# Patient Record
Sex: Female | Born: 1960
Health system: Southern US, Community
[De-identification: ages and names within clinical notes are randomized; demographics above are authoritative.]

## PROBLEM LIST (undated history)

## (undated) DIAGNOSIS — E785 Hyperlipidemia, unspecified: Secondary | ICD-10-CM

## (undated) HISTORY — PX: BLADDER SUSPENSION: SHX72

## (undated) HISTORY — DX: Hyperlipidemia, unspecified: E78.5

## (undated) HISTORY — PX: BREAST ENHANCEMENT SURGERY: SHX7

---

## 1987-02-20 HISTORY — PX: AUGMENTATION MAMMAPLASTY: SUR837

## 1988-02-20 HISTORY — PX: BREAST SURGERY: SHX581

## 2008-11-05 ENCOUNTER — Encounter: Admission: RE | Admit: 2008-11-05 | Discharge: 2008-11-05 | Payer: Self-pay | Admitting: Obstetrics and Gynecology

## 2009-12-19 ENCOUNTER — Encounter: Admission: RE | Admit: 2009-12-19 | Discharge: 2009-12-19 | Payer: Self-pay | Admitting: Obstetrics

## 2010-03-06 LAB — CBC
HCT: 36.6 % (ref 36.0–46.0)
Hemoglobin: 12.1 g/dL (ref 12.0–15.0)
MCH: 30.3 pg (ref 26.0–34.0)
MCHC: 33.1 g/dL (ref 30.0–36.0)
MCV: 91.7 fL (ref 78.0–100.0)
Platelets: 207 10*3/uL (ref 150–400)
RBC: 3.99 MIL/uL (ref 3.87–5.11)
RDW: 13.1 % (ref 11.5–15.5)
WBC: 7 10*3/uL (ref 4.0–10.5)

## 2010-03-08 ENCOUNTER — Ambulatory Visit (HOSPITAL_COMMUNITY)
Admission: RE | Admit: 2010-03-08 | Discharge: 2010-03-08 | Payer: Self-pay | Source: Home / Self Care | Attending: Obstetrics | Admitting: Obstetrics

## 2010-03-13 LAB — ABO/RH: ABO/RH(D): O POS

## 2010-03-13 LAB — PREGNANCY, URINE: Preg Test, Ur: NEGATIVE

## 2010-03-13 LAB — TYPE AND SCREEN
ABO/RH(D): O POS
Antibody Screen: NEGATIVE

## 2010-03-27 NOTE — Op Note (Signed)
NAMEBETHEL, Martha Cabrera             ACCOUNT NO.:  [id number]  MEDICAL RECORD NO.:  [id number]          PATIENT TYPE:  AMB  LOCATION:  SDC                           FACILITY:  [facility]  PHYSICIAN:  Lendon Colonel, MD   DATE OF BIRTH:  06-27-1960  DATE OF PROCEDURE:  03/08/2010 DATE OF DISCHARGE:                              OPERATIVE REPORT   PREOPERATIVE DIAGNOSES: 1. Desires contraception. 2. Due for intrauterine device change. 3. Stress urinary incontinence.  POSTOPERATIVE DIAGNOSES: 1. Desires contraception. 2. Due for intrauterine device change. 3. Stress urinary incontinence.  PROCEDURES:  IUD removal, Mirena IUD insertion, TVT sling, cystoscopy, TVT Exact sling was used.  SURGEON:  Alphonsus Sias. Ernestina Penna, MD  ASSISTANT:  None.  ANESTHESIA:  General.  FINDINGS:  Grade 1 cystocele and rectocele, 10 cm uterus.  No sling in the bladder postprocedure, bilateral peristalsing ureters, hemostatic bladder perforation site. SPECIMENS:  None.  DISPOSITION:  NA.  ANTIBIOTICS:  One gram of Ancef.  ESTIMATED BLOOD LOSS:  Minimal.  COMPLICATIONS:  Bladder perforation  INDICATIONS:  This is a 50 year old multiparous patient with significant stress urinary incontinence by history.  No urodynamics done due to vasovagal response on attempted catheterization.  Patient understands risks and benefits of sling including perforation, erosion, urinary retention, and opted for TVT sling for presumed ISD.  The patient also was due for a change of her Mirena IUD and the strings were not seen, but preoperative ultrasound confirmed IUD in the uterus.  PROCEDURE IN DETAIL:  After informed consent was obtained, the patient was taken to the operating room where general anesthesia was initially without difficulty.  She was prepped and draped in normal sterile fashion in dorsal supine high lithotomy position.  A speculum was placed into the vagina.  A single-tooth tenaculum was used to grasp  the anterior lip of the cervix.  The IUD string was not seen.  A polyp grasper was placed easily into the uterine cavity and on first pass, brought the IUD string down.  The IUD was removed without complication. The uterus was sounded to 10 cm.  Mirena IUD was then placed without complication.  Tenaculum was removed.  Gloves were changed.  The patient was reprepped.  A solution of 0.5% Marcaine with 1:200,000 units of epinephrine, 30 mL of that mixed with 60 mL of normal saline was used as an injection for the case.  Marks were placed on the mons just above the pubic symphysis 4 cm apart in the midline.  These were the planned exit points and they were marked with a marking pen.  A 20-gauge spinal needle was then placed through the left planned exit point, tracking behind the pubic bone and ending at the vesicovaginal junction in the prevesical space.  The end of the needle could be felt with the vaginal hand, 20 mL of solution was injected into the space, and additional 20 mL was injected along the planned exit tract.  This was done in a similar fashion on the opposite side.  Foley catheter was placed into the bladder.  The balloon was blown up, then midurethral position was noted by tugging on  the Foley balloon and the planned sling placement was evaluated.  Allis clamps was placed 2 cm below the urethra and additional Allis clamp placed 2 cm below that.  A 10 mL of the injection was infused into the epithelial tissue.  A 15 blade was used to incise between the two Allis clamps and the Metzenbaum scissors was used to dissect a track under the vaginal epithelium to just under the pubic bone lateral to the urethra.  This was done on both sides.  The sling device was opened and set.  A rigid Foley catheter was placed into the bladder after the bladder was drained, deviated towards the patient's right knee.  The sling device was set with the anesthesiologist pointing out the patient's right  shoulder.  The trocar was passed in the pre-made track between vaginal epithelium and the deeper tissue just under the pubic bone.  Once under the pubic bone, deviated sharply towards the anterior abdominal wall and sending out through the planned exit point. Cystoscopy was carried out.  No trocar was noted in the bladder. The trocar handle was removed after being pulled through the abdominal wall. The bladder was again drained.  A rigid Foley placed again into the urethra, this time deviating towards the patient's left knee.  The sling device was assembled and placement of the sling through the pre-made track in the vagina with aiming towards the patient's left shoulder which was pointed out by the anesthesiologist.  The sling was pushed through the vagina under the pubic bone and sharp anteversion to the abdominal wall.  Cystoscopy was carried out and at this time perforation on two sites of the bladder on the lateral wall and dome was noted.  The trocar was removed vaginally, with the sling attached. Cystoscopy was carried out.  Irrigation copious was done until the bladder cleared.  The 2.8 mm perforation site was thoroughly inspected and after several minutes became hemostatic.  The ureter was identified was far from the perforation site and good peristalsis and expulsion of clear urine was noted.  The bladder was again drained.  Rigid Foley again placed into the bladder deviated towards the patient's left knee and again the trocar was placed through the aforementioned the pre-made track once passing the pubic bone sharp anteversion to the abdominal wall and passed through the abdominal. Cystoscopy was carried out.  At this time, no abnormalities of the bladder.  Bladder was distended to 300 mL and no trocar was noted in the bladder.  Cystoscope was removed. The bladder remained filled at about 300 mL.  The trocar guide was removed.  The trocar was pulled through abdominal wall.  The  mesh was pulled up with care being taken have tension free placement, first with a Babcock in the midposition of the sling tape and then with a right angle clamp.  Once the sling was properly seated, the plastic sleeves were removed.  The vaginal epithelium was closed with figure-of- eight 2-0 Vicryl sutures.  The sling tape was then cut at the abdominal wall.  The abdominal incisions were closed with Dermabond.  Vagina was reinspected.  Hemostasis of the uterus with the IUD string, appropriate length, and hemostasis of the vaginal epithelium.  The patient tolerated the procedure well.  Foley catheter was placed.  The patient taken to recovery room for planned trial of voiding.     Lendon Colonel, MD     KAF/MEDQ  D:  03/08/2010  T:  03/08/2010  Job:  [id number]  Electronically Signed by Noland Fordyce MD on 03/27/2010 09:53:34 PM

## 2010-11-22 ENCOUNTER — Other Ambulatory Visit: Payer: Self-pay | Admitting: Obstetrics

## 2010-11-22 DIAGNOSIS — Z1231 Encounter for screening mammogram for malignant neoplasm of breast: Secondary | ICD-10-CM

## 2010-12-22 ENCOUNTER — Ambulatory Visit: Payer: Self-pay

## 2011-01-15 ENCOUNTER — Ambulatory Visit
Admission: RE | Admit: 2011-01-15 | Discharge: 2011-01-15 | Disposition: A | Discharge status: Home / Self Care | Payer: PRIVATE HEALTH INSURANCE | Source: Ambulatory Visit | Attending: Obstetrics | Admitting: Obstetrics

## 2011-01-15 DIAGNOSIS — Z1231 Encounter for screening mammogram for malignant neoplasm of breast: Secondary | ICD-10-CM

## 2011-03-12 ENCOUNTER — Encounter: Payer: Self-pay | Admitting: Internal Medicine

## 2011-03-12 ENCOUNTER — Ambulatory Visit (INDEPENDENT_AMBULATORY_CARE_PROVIDER_SITE_OTHER): Payer: PRIVATE HEALTH INSURANCE | Admitting: Internal Medicine

## 2011-03-12 VITALS — BP 110/76 | HR 76 | Temp 98.6°F | Ht 68.5 in | Wt 130.5 lb

## 2011-03-12 DIAGNOSIS — Z0000 Encounter for general adult medical examination without abnormal findings: Secondary | ICD-10-CM

## 2011-03-12 LAB — POCT URINALYSIS DIPSTICK
Bilirubin, UA: NEGATIVE
Glucose, UA: NEGATIVE
Ketones, UA: NEGATIVE
Leukocytes, UA: NEGATIVE
Nitrite, UA: NEGATIVE
Protein, UA: NEGATIVE
Spec Grav, UA: 1.01
Urobilinogen, UA: NEGATIVE
pH, UA: 6

## 2011-03-12 NOTE — Progress Notes (Signed)
  Subjective:    Patient ID: Martha Cabrera, female    DOB: 1960/03/15, 51 y.o.   MRN: [id number]  HPI  First visit for this pleasant 51 year old white female with no known medical problems. Sees Dr. Algie Coffer at [facility] for GYN care. 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 Use to work full-time for [employer]. History of serious illnesses or accidents. Had bladder sling surgery January 2012 by Dr. Algie Coffer. Had breast augmentation in [place]. No known drug allergies. No chronic medications. Hasn't been taking vitamin D or calcium on a regular basis. Does take multivitamin.  Married with 2 children. Does not smoke. Social alcohol consumption. Completed 4 years of college.    Review of Systems recent issues with right tennis elbow. Is getting physical therapy for this. Otherwise noncontributory review of systems.     Objective:   Physical Exam  Vitals reviewed. Constitutional: She is oriented to person, place, and time. She appears well-developed and well-nourished. No distress.  HENT:  Head: Normocephalic and atraumatic.  Right Ear: External ear normal.  Left Ear: External ear normal.  Nose: Nose normal.  Mouth/Throat: Oropharynx is clear and moist. No oropharyngeal exudate.  Eyes: Conjunctivae and EOM are normal. Pupils are equal, round, and reactive to light. Right eye exhibits no discharge. Left eye exhibits no discharge. No scleral icterus.  Neck: Neck supple. No JVD present. No thyromegaly present.  Cardiovascular: Normal rate, regular rhythm, normal heart sounds and intact distal pulses.   No murmur heard. Pulmonary/Chest: Effort normal and breath sounds normal. She has no rales.  Abdominal: Soft. Bowel sounds are normal. She exhibits no distension and no mass. There is no tenderness. There is no rebound and no guarding.  Genitourinary:       Deferred to GYN physician  Musculoskeletal: Normal range of motion. She exhibits no edema.       Tender  right lateral epicondyle  Lymphadenopathy:    She has no cervical adenopathy.  Neurological: She is alert and oriented to person, place, and time. She has normal reflexes. No cranial nerve deficit. Coordination normal.  Skin: Skin is warm and dry. No rash noted. She is not diaphoretic.  Psychiatric: She has a normal mood and affect. Her behavior is normal. Judgment and thought content normal.          Assessment & Plan:   Right lateral epicondylitis  History of bladder sling surgery for incontinence  Plan: Return one year or as needed. Vitamin D level checked today. Recent lab work reviewed. Has fasting LDL of 110. Total cholesterol 185. Triglycerides 42. HDL cholesterol 67. TSH normal at 1.91. Comprehensive metabolic panel

## 2011-03-12 NOTE — Patient Instructions (Signed)
Return one year or as needed.

## 2011-03-13 LAB — VITAMIN D 25 HYDROXY (VIT D DEFICIENCY, FRACTURES): Vit D, 25-Hydroxy: 36 ng/mL (ref 30–89)

## 2011-05-03 ENCOUNTER — Ambulatory Visit (INDEPENDENT_AMBULATORY_CARE_PROVIDER_SITE_OTHER): Payer: PRIVATE HEALTH INSURANCE | Admitting: Internal Medicine

## 2011-05-03 ENCOUNTER — Ambulatory Visit
Admission: RE | Admit: 2011-05-03 | Discharge: 2011-05-03 | Disposition: A | Discharge status: Home / Self Care | Payer: PRIVATE HEALTH INSURANCE | Source: Ambulatory Visit | Attending: Internal Medicine | Admitting: Internal Medicine

## 2011-05-03 ENCOUNTER — Encounter: Payer: Self-pay | Admitting: Internal Medicine

## 2011-05-03 VITALS — BP 98/70 | HR 80 | Temp 99.7°F | Wt 128.0 lb

## 2011-05-03 DIAGNOSIS — R059 Cough, unspecified: Secondary | ICD-10-CM

## 2011-05-03 DIAGNOSIS — R05 Cough: Secondary | ICD-10-CM

## 2011-05-03 DIAGNOSIS — R509 Fever, unspecified: Secondary | ICD-10-CM

## 2011-05-03 LAB — CBC WITH DIFFERENTIAL/PLATELET
HCT: 39.3 % (ref 36.0–46.0)
Hemoglobin: 12.7 g/dL (ref 12.0–15.0)
Lymphocytes Relative: 27 % (ref 12–46)
Lymphs Abs: 0.8 10*3/uL (ref 0.7–4.0)
MCH: 30.3 pg (ref 26.0–34.0)
MCHC: 32.5 g/dL (ref 30.0–36.0)
MCV: 93.6 fL (ref 78.0–100.0)
Monocytes Absolute: 0.4 10*3/uL (ref 0.1–1.0)
Monocytes Relative: 12 % (ref 3–12)
Neutro Abs: 1.8 10*3/uL (ref 1.7–7.7)
Neutrophils Relative %: 61 % (ref 43–77)
Platelets: 138 10*3/uL — ABNORMAL LOW (ref 150–400)
RBC: 4.19 MIL/uL (ref 3.87–5.11)
RDW: 12.9 % (ref 11.5–15.5)
WBC: 3 10*3/uL — ABNORMAL LOW (ref 4.0–10.5)

## 2011-05-03 LAB — INFLUENZA A AND B
Inflenza A Ag: NEGATIVE
Influenza B Ag: NEGATIVE

## 2011-05-03 LAB — POCT RAPID STREP A (OFFICE): Rapid Strep A Screen: NEGATIVE

## 2011-05-03 NOTE — Progress Notes (Signed)
  Subjective:    Patient ID: Martha Cabrera, female    DOB: 08/10/1960, 51 y.o.   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 Says she had chills day before yesterday. Stayed in bed all day yesterday and had severe headache. Feels fullness in maxillary sinus area and has postnasal drip. Some cough. Son has had strep throat. Patient does not have sore throat. Does not have myalgias. Has malaise and fatigue. Chills have resolved. No nausea vomiting or diarrhea. No temperature 100 or greater.    Review of Systems     Objective:   Physical Exam looks pale and fatigued. Pharynx very slightly injected. Rapid strep screen: Negative. Neck is supple. No significant adenopathy. TMs clear. Chest egophony left lower lobe. No rales. Influenza rapid test  taken. CBC with differential pending.        Assessment & Plan:   Viral syndrome  Bronchitis  Plan: Avelox 400 mg daily for 7 days. For headache, Vicodin 5/500 one by mouth Q6 to 8 hours when necessary headache.  Addendum: Patient is to have repeat CBC in 3 weeks as she has slightly low white blood cell count of 3000 and slightly low platelet count of 138,000 consistent with viral syndrome. Differential is normal. No lymphocytosis. Chest x-ray is normal. Rapid influenza test is negative. Rapid strep screen is negative.

## 2011-05-03 NOTE — Patient Instructions (Signed)
Take Avelox daily with a meal for 7 days. Take Vicodin every 6-8 hours as needed for headache. Call if not better in 48 hours or sooner if worse.

## 2011-05-07 ENCOUNTER — Ambulatory Visit (INDEPENDENT_AMBULATORY_CARE_PROVIDER_SITE_OTHER): Payer: PRIVATE HEALTH INSURANCE | Admitting: Internal Medicine

## 2011-05-07 VITALS — BP 98/70 | HR 76 | Temp 98.2°F | Wt 128.0 lb

## 2011-05-07 DIAGNOSIS — J40 Bronchitis, not specified as acute or chronic: Secondary | ICD-10-CM

## 2011-05-07 DIAGNOSIS — B349 Viral infection, unspecified: Secondary | ICD-10-CM

## 2011-05-07 DIAGNOSIS — R5381 Other malaise: Secondary | ICD-10-CM

## 2011-05-07 DIAGNOSIS — R5383 Other fatigue: Secondary | ICD-10-CM

## 2011-05-07 DIAGNOSIS — B9789 Other viral agents as the cause of diseases classified elsewhere: Secondary | ICD-10-CM

## 2011-05-08 LAB — COMPREHENSIVE METABOLIC PANEL
ALT: 13 U/L (ref 0–35)
AST: 19 U/L (ref 0–37)
Albumin: 4.2 g/dL (ref 3.5–5.2)
Alkaline Phosphatase: 52 U/L (ref 39–117)
BUN: 10 mg/dL (ref 6–23)
CO2: 30 mEq/L (ref 19–32)
Calcium: 9 mg/dL (ref 8.4–10.5)
Chloride: 102 mEq/L (ref 96–112)
Creat: 0.68 mg/dL (ref 0.50–1.10)
Glucose, Bld: 79 mg/dL (ref 70–99)
Potassium: 4.5 mEq/L (ref 3.5–5.3)
Sodium: 142 mEq/L (ref 135–145)
Total Bilirubin: 0.3 mg/dL (ref 0.3–1.2)
Total Protein: 6.6 g/dL (ref 6.0–8.3)

## 2011-05-08 LAB — EPSTEIN-BARR VIRUS VCA ANTIBODY PANEL
EBV EA IgG: 1.28 {ISR} — ABNORMAL HIGH
EBV NA IgG: 2.21 {ISR} — ABNORMAL HIGH
EBV VCA IgG: 4.65 {ISR} — ABNORMAL HIGH
EBV VCA IgM: 0.21 {ISR}

## 2011-05-08 LAB — CBC WITH DIFFERENTIAL/PLATELET
Basophils Absolute: 0 10*3/uL (ref 0.0–0.1)
Basophils Relative: 1 % (ref 0–1)
Eosinophils Absolute: 0 10*3/uL (ref 0.0–0.7)
Eosinophils Relative: 1 % (ref 0–5)
HCT: 42.7 % (ref 36.0–46.0)
Hemoglobin: 13.8 g/dL (ref 12.0–15.0)
Lymphocytes Relative: 51 % — ABNORMAL HIGH (ref 12–46)
Lymphs Abs: 1.4 10*3/uL (ref 0.7–4.0)
MCH: 29.5 pg (ref 26.0–34.0)
MCHC: 32.3 g/dL (ref 30.0–36.0)
MCV: 91.2 fL (ref 78.0–100.0)
Monocytes Absolute: 0.4 10*3/uL (ref 0.1–1.0)
Monocytes Relative: 15 % — ABNORMAL HIGH (ref 3–12)
Neutro Abs: 0.9 10*3/uL — ABNORMAL LOW (ref 1.7–7.7)
Neutrophils Relative %: 32 % — ABNORMAL LOW (ref 43–77)
Platelets: 164 10*3/uL (ref 150–400)
RBC: 4.68 MIL/uL (ref 3.87–5.11)
RDW: 12.5 % (ref 11.5–15.5)
WBC: 2.8 10*3/uL — ABNORMAL LOW (ref 4.0–10.5)

## 2011-05-08 LAB — PATHOLOGIST SMEAR REVIEW

## 2011-05-09 LAB — CMV IGM: CMV IgM: 0.1 (ref <0.90)

## 2011-05-17 ENCOUNTER — Encounter: Payer: Self-pay | Admitting: Internal Medicine

## 2011-05-17 NOTE — Progress Notes (Signed)
  Subjective:    Patient ID: Martha Cabrera, female    DOB: 07-01-1960, 51 y.o.   MRN: [id number]  HPI 51 year old white female generally healthy was here recently diagnosed with viral syndrome and bronchitis. Nasal swab for influenza A and B. were negative. Patient says she feels worse. Has malaise, fatigue, lack of energy, myalgias.  She is worried that she's not gotten better in a few days. Still running some fever. Has been taking  Avelox.    Review of Systems     Objective:   Physical Exam HEENT exam: TMs are clear, pharynx is clear, neck is supple, chest clear.        Assessment & Plan:  Viral syndrome Vi  Plan: Check Epstein-Barr and CMV titers Check Epstein-Barr and CMV titers    Viral syndrome  Check Epstein-Barr and CMV titers. Probably can stop Tamiflu since nasal swabs were negative. See lab results. Call if not better in one week or sooner if worse.

## 2011-05-17 NOTE — Patient Instructions (Signed)
Continue rest and plenty of fluids. Call if not better in one week or sooner if worse. We have drawn Epstein-Barr and CMV titers today.

## 2011-11-15 ENCOUNTER — Ambulatory Visit (INDEPENDENT_AMBULATORY_CARE_PROVIDER_SITE_OTHER): Payer: PRIVATE HEALTH INSURANCE | Admitting: Internal Medicine

## 2011-11-15 VITALS — BP 112/74 | HR 76 | Temp 98.6°F | Wt 128.0 lb

## 2011-11-15 DIAGNOSIS — N390 Urinary tract infection, site not specified: Secondary | ICD-10-CM

## 2011-11-15 DIAGNOSIS — R300 Dysuria: Secondary | ICD-10-CM

## 2011-11-17 ENCOUNTER — Encounter: Payer: Self-pay | Admitting: Internal Medicine

## 2011-11-17 NOTE — Patient Instructions (Addendum)
Take Cipro as prescribed. Follow symptoms not better in 24-48 hours

## 2011-11-17 NOTE — Progress Notes (Signed)
  Subjective:    Patient ID: Martha Cabrera, female    DOB: [birth year], 51 y.o.   MRN: [id number]  HPI 24-hour history of dysuria. Has burning at end of stream. No back pain. No nausea. No fever or shaking chills. Has not had a urinary tract infection and some time. Patient indicates she thinks Avelox made her dizzy and nauseated when she took it for respiratory infection a few months ago. Urinary tract infection  Plan: Cipro 250 mg by mouth twice daily for 7 days    Review of Systems     Objective:   Physical Exam urinalysis has 1+ nitrite and LE. Culture was sent. No CVA tenderness on exam        Assessment & Plan:

## 2011-11-18 LAB — URINE CULTURE: Colony Count: >=100000

## 2011-12-12 ENCOUNTER — Other Ambulatory Visit: Payer: Self-pay | Admitting: *Deleted

## 2011-12-12 ENCOUNTER — Other Ambulatory Visit: Payer: Self-pay | Admitting: Obstetrics

## 2011-12-12 DIAGNOSIS — Z1231 Encounter for screening mammogram for malignant neoplasm of breast: Secondary | ICD-10-CM

## 2012-01-09 ENCOUNTER — Encounter: Payer: Self-pay | Admitting: Internal Medicine

## 2012-01-21 ENCOUNTER — Ambulatory Visit
Admission: RE | Admit: 2012-01-21 | Discharge: 2012-01-21 | Disposition: A | Discharge status: Home / Self Care | Payer: PRIVATE HEALTH INSURANCE | Source: Ambulatory Visit | Attending: Obstetrics | Admitting: Obstetrics

## 2012-01-21 DIAGNOSIS — Z1231 Encounter for screening mammogram for malignant neoplasm of breast: Secondary | ICD-10-CM

## 2012-02-22 ENCOUNTER — Ambulatory Visit (AMBULATORY_SURGERY_CENTER): Payer: PRIVATE HEALTH INSURANCE | Admitting: *Deleted

## 2012-02-22 VITALS — Ht 68.5 in | Wt 127.6 lb

## 2012-02-22 DIAGNOSIS — Z1211 Encounter for screening for malignant neoplasm of colon: Secondary | ICD-10-CM

## 2012-02-22 MED ORDER — MOVIPREP 100 G PO SOLR: ORAL | Status: DC

## 2012-03-03 ENCOUNTER — Ambulatory Visit
Admission: RE | Admit: 2012-03-03 | Discharge: 2012-03-03 | Disposition: A | Discharge status: Home / Self Care | Payer: PRIVATE HEALTH INSURANCE | Source: Ambulatory Visit | Attending: Internal Medicine | Admitting: Internal Medicine

## 2012-03-03 ENCOUNTER — Ambulatory Visit (INDEPENDENT_AMBULATORY_CARE_PROVIDER_SITE_OTHER): Payer: PRIVATE HEALTH INSURANCE | Admitting: Internal Medicine

## 2012-03-03 ENCOUNTER — Telehealth: Payer: Self-pay | Admitting: Internal Medicine

## 2012-03-03 ENCOUNTER — Encounter: Payer: Self-pay | Admitting: Internal Medicine

## 2012-03-03 VITALS — BP 104/66 | HR 80 | Temp 99.0°F | Wt 127.0 lb

## 2012-03-03 DIAGNOSIS — R109 Unspecified abdominal pain: Secondary | ICD-10-CM

## 2012-03-03 DIAGNOSIS — R110 Nausea: Secondary | ICD-10-CM

## 2012-03-03 DIAGNOSIS — A059 Bacterial foodborne intoxication, unspecified: Secondary | ICD-10-CM

## 2012-03-03 LAB — CBC WITH DIFFERENTIAL/PLATELET
Basophils Absolute: 0 10*3/uL (ref 0.0–0.1)
Basophils Relative: 1 % (ref 0–1)
Eosinophils Absolute: 0.1 10*3/uL (ref 0.0–0.7)
Eosinophils Relative: 2 % (ref 0–5)
HCT: 37.6 % (ref 36.0–46.0)
Hemoglobin: 12.7 g/dL (ref 12.0–15.0)
Lymphocytes Relative: 31 % (ref 12–46)
Lymphs Abs: 1.4 10*3/uL (ref 0.7–4.0)
MCH: 30.4 pg (ref 26.0–34.0)
MCHC: 33.8 g/dL (ref 30.0–36.0)
MCV: 90 fL (ref 78.0–100.0)
Monocytes Absolute: 0.5 10*3/uL (ref 0.1–1.0)
Monocytes Relative: 11 % (ref 3–12)
Neutro Abs: 2.6 10*3/uL (ref 1.7–7.7)
Neutrophils Relative %: 55 % (ref 43–77)
Platelets: 218 10*3/uL (ref 150–400)
RBC: 4.18 MIL/uL (ref 3.87–5.11)
RDW: 13.6 % (ref 11.5–15.5)
WBC: 4.6 10*3/uL (ref 4.0–10.5)

## 2012-03-03 LAB — COMPREHENSIVE METABOLIC PANEL
ALT: 12 U/L (ref 0–35)
AST: 19 U/L (ref 0–37)
Albumin: 4.1 g/dL (ref 3.5–5.2)
Alkaline Phosphatase: 48 U/L (ref 39–117)
BUN: 13 mg/dL (ref 6–23)
CO2: 29 mEq/L (ref 19–32)
Calcium: 9 mg/dL (ref 8.4–10.5)
Chloride: 104 mEq/L (ref 96–112)
Creat: 0.6 mg/dL (ref 0.50–1.10)
Glucose, Bld: 62 mg/dL — ABNORMAL LOW (ref 70–99)
Potassium: 4.1 mEq/L (ref 3.5–5.3)
Sodium: 140 mEq/L (ref 135–145)
Total Bilirubin: 0.4 mg/dL (ref 0.3–1.2)
Total Protein: 6.2 g/dL (ref 6.0–8.3)

## 2012-03-03 NOTE — Progress Notes (Signed)
Patient informed. 

## 2012-03-03 NOTE — Progress Notes (Signed)
  Subjective:    Patient ID: Martha Cabrera, female    DOB: [birth year], 52 y.o.   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 Part of her job will be fine raising. Says she likes her new job. On Thursday evening January 9, she ate at [employer]. She had a burrito that contained chicken, cheese, lettuce and tomato. Later that evening she developed extreme nausea. Said her stomach had a lot of gas that she could not pass. It was rolling around. She did not vomit. No diarrhea. No fever or shaking chills. The next day she did not feel any better and stayed in bed and ate very little. The following day she tried to go to church. She ate green beans and pork tenderloin at church and nausea has recurred. No history of gallbladder disease. She has a Mirena IUD and is 52 years also it's unlikely she is pregnant. No significant abdominal pain just extreme nausea and gas.    Review of Systems     Objective:   Physical Exam Bowel sounds are increased. Abdomen is soft and nondistended. Slight tenderness to right of umbilicus without rebound tenderness. No hepatosplenomegaly. No masses.        Assessment & Plan:                              Possible food borne illness  Possible viral syndrome  Possible cholecystitis  Plan: Cipro 500 mg by mouth twice a day for 3 days. KUB flat and upright abdominal film. Call if symptoms persist. May take Phenergan 25 mg tablets as needed for nausea.  Time spent seeing patient and making evaluation 25 minutes

## 2012-03-04 LAB — LIPASE: Lipase: 14 U/L (ref 0–75)

## 2012-03-04 LAB — AMYLASE: Amylase: 35 U/L (ref 0–105)

## 2012-03-07 ENCOUNTER — Encounter: Payer: PRIVATE HEALTH INSURANCE | Admitting: Internal Medicine

## 2012-04-10 ENCOUNTER — Telehealth: Payer: Self-pay | Admitting: Internal Medicine

## 2012-04-10 ENCOUNTER — Telehealth: Payer: Self-pay | Admitting: *Deleted

## 2012-04-10 NOTE — Telephone Encounter (Signed)
Patient had oatmeal, yogurt and bacon this am.  She is on clear liquids now, and promised not to eat anything else except the broth etc..  She has a procedure tomorrow.

## 2012-04-11 ENCOUNTER — Ambulatory Visit (AMBULATORY_SURGERY_CENTER): Payer: PRIVATE HEALTH INSURANCE | Admitting: Internal Medicine

## 2012-04-11 ENCOUNTER — Encounter: Payer: Self-pay | Admitting: Internal Medicine

## 2012-04-11 VITALS — BP 107/61 | HR 68 | Temp 98.5°F | Resp 13 | Ht 68.0 in | Wt 127.0 lb

## 2012-04-11 DIAGNOSIS — Z1211 Encounter for screening for malignant neoplasm of colon: Secondary | ICD-10-CM

## 2012-04-11 MED ORDER — SODIUM CHLORIDE 0.9 % IV SOLN: 500.0000 mL | INTRAVENOUS | Status: DC

## 2012-04-11 NOTE — Patient Instructions (Addendum)

## 2012-04-11 NOTE — Progress Notes (Signed)
To pacu report to rn, vss, bbs=clear

## 2012-04-11 NOTE — Progress Notes (Signed)
Patient did not have preoperative order for IV antibiotic SSI prophylaxis. (G8918)  Patient did not experience any of the following events: a burn prior to discharge; a fall within the facility; wrong site/side/patient/procedure/implant event; or a hospital transfer or hospital admission upon discharge from the facility. (G8907)  

## 2012-04-11 NOTE — Progress Notes (Signed)
Patient did not have preoperative order for IV antibiotic SSI prophylaxis. (G8918)   

## 2012-04-11 NOTE — Op Note (Signed)
[facility] [address] [place], [address]   COLONOSCOPY PROCEDURE REPORT  PATIENT: Martha, Cabrera  MR#: [id number] BIRTHDATE: 1960-08-19 , 51  yrs. old GENDER: Female ENDOSCOPIST: Hart Carwin, MD REFERRED BY:  Sharlet Salina, M.D. PROCEDURE DATE:  04/11/2012 PROCEDURE:   Colonoscopy, screening ASA CLASS:   Class I INDICATIONS:Average risk patient for colon cancer. MEDICATIONS: MAC sedation, administered by CRNA and propofol (Diprivan) 250mg  IV  DESCRIPTION OF PROCEDURE:   After the risks and benefits and of the procedure were explained, informed consent was obtained.  A digital rectal exam revealed no abnormalities of the rectum.    The LB [id number]-[id number]  endoscope was introduced through the anus and advanced to the cecum, which was identified by both the appendix and ileocecal valve .  The quality of the prep was good, using MoviPrep .  The instrument was then slowly withdrawn as the colon was fully examined.     COLON FINDINGS: A normal appearing cecum, ileocecal valve, and appendiceal orifice were identified.  The ascending, hepatic flexure, transverse, splenic flexure, descending, sigmoid colon and rectum appeared unremarkable.  No polyps or cancers were seen. Retroflexed views revealed no abnormalities.     The scope was then withdrawn from the patient and the procedure completed.  COMPLICATIONS: There were no complications. ENDOSCOPIC IMPRESSION: Normal colon  RECOMMENDATIONS: High fiber diet   REPEAT EXAM: In 10 year(s)  for Colonoscopy.  cc:  _______________________________ eSignedHart Carwin, MD 04/11/2012 10:37 AM

## 2012-04-14 ENCOUNTER — Telehealth: Payer: Self-pay

## 2012-04-14 NOTE — Telephone Encounter (Signed)
  Follow up Call-  Call back number 04/11/2012  Post procedure Call Back phone  # [phone]  Permission to leave phone message Yes     Patient questions:  Do you have a fever, pain , or abdominal swelling? no Pain Score  0 *  Have you tolerated food without any problems? yes  Have you been able to return to your normal activities? yes  Do you have any questions about your discharge instructions: Diet   no Medications  no Follow up visit  no  Do you have questions or concerns about your Care? no  Actions: * If pain score is 4 or above: No action needed, pain <4.

## 2012-04-26 NOTE — Patient Instructions (Addendum)
Take Cipro 500 mg twice daily for 3 days. Take Phenergan as needed for nausea. Clear liquids until symptoms resolve. KUB flat and upright abdominal film

## 2012-05-23 NOTE — Telephone Encounter (Signed)
Questions answered in other note

## 2012-08-12 ENCOUNTER — Other Ambulatory Visit: Payer: Self-pay | Admitting: Obstetrics & Gynecology

## 2012-08-12 DIAGNOSIS — N6310 Unspecified lump in the right breast, unspecified quadrant: Secondary | ICD-10-CM

## 2012-08-15 ENCOUNTER — Other Ambulatory Visit: Payer: Self-pay | Admitting: Obstetrics

## 2012-08-15 ENCOUNTER — Other Ambulatory Visit: Payer: Self-pay | Admitting: Obstetrics & Gynecology

## 2012-08-15 DIAGNOSIS — N6310 Unspecified lump in the right breast, unspecified quadrant: Secondary | ICD-10-CM

## 2012-08-25 ENCOUNTER — Ambulatory Visit
Admission: RE | Admit: 2012-08-25 | Discharge: 2012-08-25 | Disposition: A | Discharge status: Home / Self Care | Payer: PRIVATE HEALTH INSURANCE | Source: Ambulatory Visit | Attending: Obstetrics & Gynecology | Admitting: Obstetrics & Gynecology

## 2012-08-25 ENCOUNTER — Other Ambulatory Visit: Payer: Self-pay | Admitting: Obstetrics & Gynecology

## 2012-08-25 DIAGNOSIS — N6310 Unspecified lump in the right breast, unspecified quadrant: Secondary | ICD-10-CM

## 2012-09-22 ENCOUNTER — Ambulatory Visit
Admission: RE | Admit: 2012-09-22 | Discharge: 2012-09-22 | Disposition: A | Discharge status: Home / Self Care | Payer: PRIVATE HEALTH INSURANCE | Source: Ambulatory Visit | Attending: Obstetrics & Gynecology | Admitting: Obstetrics & Gynecology

## 2012-09-22 DIAGNOSIS — N6310 Unspecified lump in the right breast, unspecified quadrant: Secondary | ICD-10-CM

## 2012-12-17 ENCOUNTER — Other Ambulatory Visit: Payer: Self-pay

## 2012-12-17 DIAGNOSIS — Z1231 Encounter for screening mammogram for malignant neoplasm of breast: Secondary | ICD-10-CM

## 2013-01-21 IMAGING — CR DG ABDOMEN 2V
2 series · 2 of 2 positions shown · non-contrast
Comparison: None.

CLINICAL DATA: Abdominal pain, nausea for 4 days

ABDOMEN - 2 VIEW

[view not recorded (1 of 2)]
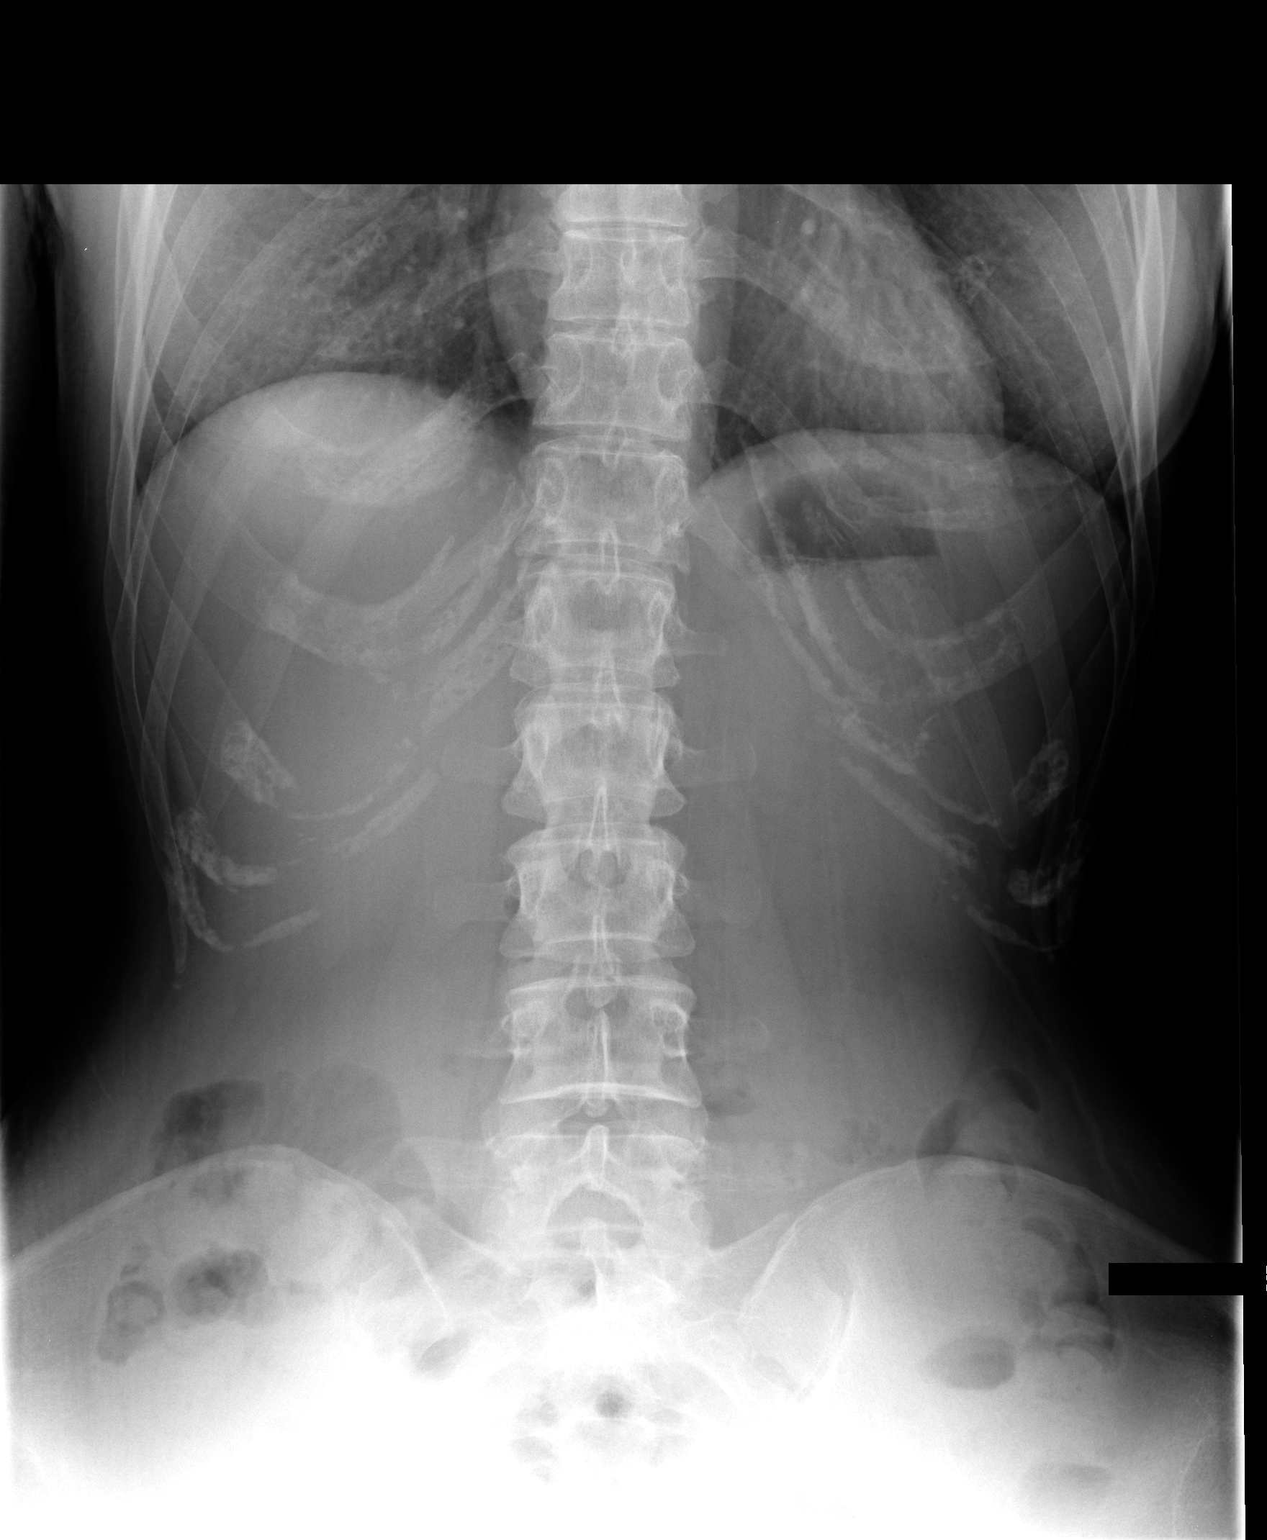

[view not recorded (2 of 2)]
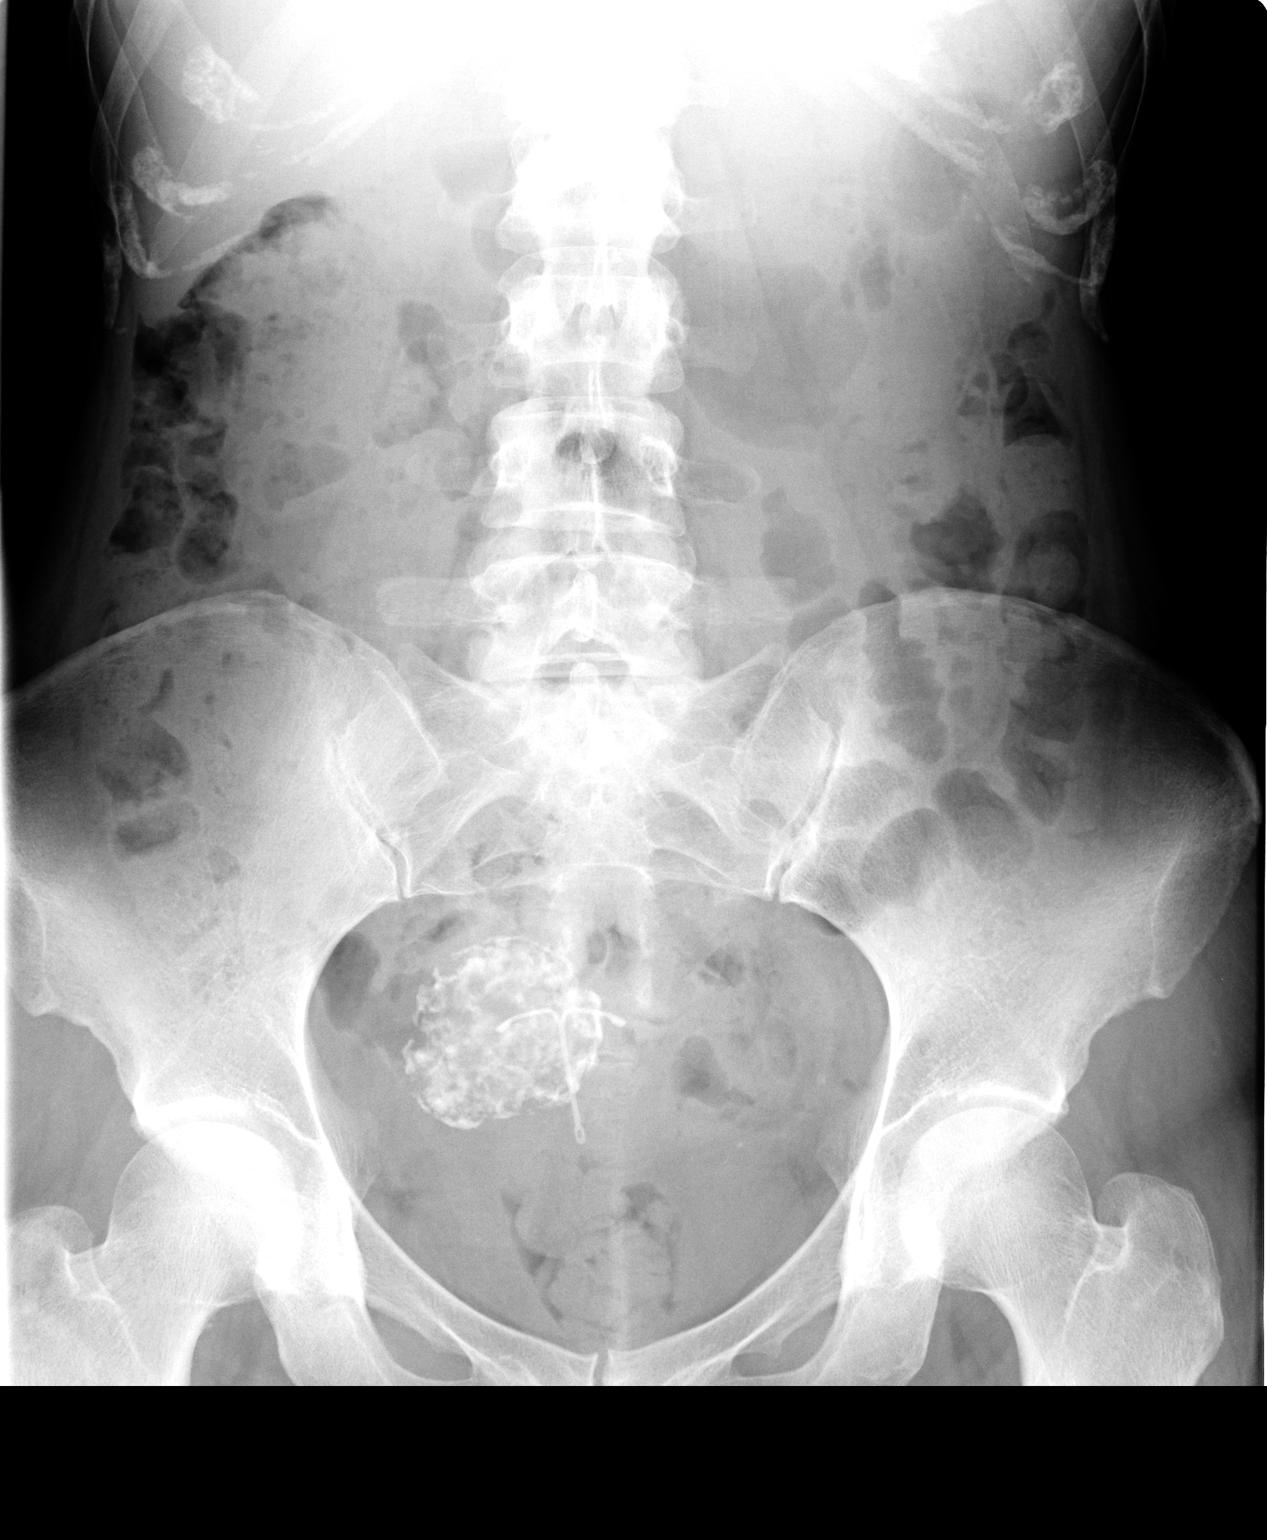

[2 of 2 positions shown; findings below may reference images not displayed]

FINDINGS: The bowel gas pattern is nonspecific with no obstruction.
No free air is seen on the erect view.  No opaque calculi are
noted.  Mottled calcifications in the right pelvis are consistent
with a calcified uterine fibroid of approximately 5.5 cm in
diameter.  An IUD also is noted in the midline.  No bony
abnormality is seen.
IMPRESSION: 1.  No bowel obstruction.  No free air.
2.  5.5 cm calcified uterine fibroid.
3.  IUD.

## 2013-01-23 ENCOUNTER — Other Ambulatory Visit: Payer: Self-pay

## 2013-01-23 ENCOUNTER — Ambulatory Visit
Admission: RE | Admit: 2013-01-23 | Discharge: 2013-01-23 | Disposition: A | Discharge status: Home / Self Care | Payer: PRIVATE HEALTH INSURANCE | Source: Ambulatory Visit

## 2013-01-23 DIAGNOSIS — Z1231 Encounter for screening mammogram for malignant neoplasm of breast: Secondary | ICD-10-CM

## 2013-03-03 ENCOUNTER — Other Ambulatory Visit (HOSPITAL_COMMUNITY): Payer: Self-pay | Admitting: Sports Medicine

## 2013-03-03 DIAGNOSIS — M7712 Lateral epicondylitis, left elbow: Secondary | ICD-10-CM

## 2013-03-11 ENCOUNTER — Ambulatory Visit (HOSPITAL_COMMUNITY)
Admission: RE | Admit: 2013-03-11 | Discharge: 2013-03-11 | Disposition: A | Discharge status: Home / Self Care | Payer: PRIVATE HEALTH INSURANCE | Source: Ambulatory Visit | Attending: Sports Medicine | Admitting: Sports Medicine

## 2013-03-11 DIAGNOSIS — M7712 Lateral epicondylitis, left elbow: Secondary | ICD-10-CM

## 2013-03-11 DIAGNOSIS — R609 Edema, unspecified: Secondary | ICD-10-CM | POA: Insufficient documentation

## 2013-03-11 DIAGNOSIS — M66239 Spontaneous rupture of extensor tendons, unspecified forearm: Secondary | ICD-10-CM | POA: Insufficient documentation

## 2013-03-11 DIAGNOSIS — M259 Joint disorder, unspecified: Secondary | ICD-10-CM | POA: Insufficient documentation

## 2013-03-11 DIAGNOSIS — M66249 Spontaneous rupture of extensor tendons, unspecified hand: Principal | ICD-10-CM | POA: Insufficient documentation

## 2013-12-18 ENCOUNTER — Other Ambulatory Visit: Payer: Self-pay

## 2013-12-18 DIAGNOSIS — Z1231 Encounter for screening mammogram for malignant neoplasm of breast: Secondary | ICD-10-CM

## 2014-01-25 ENCOUNTER — Ambulatory Visit
Admission: RE | Admit: 2014-01-25 | Discharge: 2014-01-25 | Disposition: A | Discharge status: Home / Self Care | Payer: PRIVATE HEALTH INSURANCE | Source: Ambulatory Visit

## 2014-01-25 DIAGNOSIS — Z1231 Encounter for screening mammogram for malignant neoplasm of breast: Secondary | ICD-10-CM

## 2014-01-26 ENCOUNTER — Other Ambulatory Visit: Payer: Self-pay | Admitting: Internal Medicine

## 2014-01-26 DIAGNOSIS — R928 Other abnormal and inconclusive findings on diagnostic imaging of breast: Secondary | ICD-10-CM

## 2014-02-03 ENCOUNTER — Ambulatory Visit
Admission: RE | Admit: 2014-02-03 | Discharge: 2014-02-03 | Disposition: A | Discharge status: Home / Self Care | Payer: PRIVATE HEALTH INSURANCE | Source: Ambulatory Visit | Attending: Internal Medicine | Admitting: Internal Medicine

## 2014-02-03 DIAGNOSIS — R928 Other abnormal and inconclusive findings on diagnostic imaging of breast: Secondary | ICD-10-CM

## 2014-12-16 ENCOUNTER — Ambulatory Visit (INDEPENDENT_AMBULATORY_CARE_PROVIDER_SITE_OTHER): Payer: No Typology Code available for payment source | Admitting: Internal Medicine

## 2014-12-16 ENCOUNTER — Encounter: Payer: Self-pay | Admitting: Internal Medicine

## 2014-12-16 VITALS — BP 112/60 | HR 67 | Temp 98.1°F | Resp 18 | Ht 68.0 in | Wt 133.5 lb

## 2014-12-16 DIAGNOSIS — R300 Dysuria: Secondary | ICD-10-CM

## 2014-12-16 DIAGNOSIS — Z0000 Encounter for general adult medical examination without abnormal findings: Secondary | ICD-10-CM | POA: Diagnosis not present

## 2014-12-16 LAB — POCT URINALYSIS DIPSTICK
Bilirubin, UA: NEGATIVE
Blood, UA: NEGATIVE
Glucose, UA: NEGATIVE
KETONES UA: NEGATIVE
NITRITE UA: NEGATIVE
PH UA: 7
PROTEIN UA: NEGATIVE
Spec Grav, UA: 1.01
Urobilinogen, UA: 0.2

## 2014-12-16 MED ORDER — CIPROFLOXACIN HCL 500 MG PO TABS: 500.0000 mg | ORAL_TABLET | Freq: Two times a day (BID) | ORAL | Status: DC

## 2014-12-16 NOTE — Patient Instructions (Signed)
It was pleasure to see you today. I hope you feel better soon. Cipro 500 mg twice daily for 7 days. Azo-Standard over-the-counter for symptomatic dysuria. Culture is pending.

## 2014-12-16 NOTE — Progress Notes (Signed)
   Subjective:    Patient ID: Martha Cabrera, female    DOB: 03/16/1960, 54 y.o.   MRN: [id number]  HPI 54 year old female in today with UTI symptoms onset yesterday. Has dysuria and pain at end of stream. No fever, chills, back pain. No nausea and vomiting.  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 Now employed by [employer].  Review of Systems     Objective:   Physical Exam  Urine dipstick results show LE present. Culture sent. No CVA tenderness.      Assessment & Plan:  Acute UTI  Plan: Cipro 500 mg twice daily for 7 days. May take Azo-Standard over-the-counter.

## 2014-12-18 LAB — CULTURE, URINE COMPREHENSIVE: Colony Count: 15000

## 2015-01-07 ENCOUNTER — Other Ambulatory Visit: Payer: Self-pay

## 2015-01-07 DIAGNOSIS — Z1231 Encounter for screening mammogram for malignant neoplasm of breast: Secondary | ICD-10-CM

## 2015-01-31 ENCOUNTER — Telehealth: Payer: Self-pay | Admitting: Internal Medicine

## 2015-01-31 NOTE — Telephone Encounter (Signed)
Co-worker has just been diagnosed with Shingles on Friday, 12/9.  However, she potentially had them as far back as maybe Thanksgiving.  Martha Cabrera has NEVER had the chicken pox.  She had a chicken pox shot in her 30's.  She is wondering how long those shots are effective??  She wants to know if she can come in and have another vaccine?  Or, should she have a titer drawn?  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  She states they don't hug one another every day, however, they perhaps have come in contact by hugging since Thanksgiving.  She is concerned enough that she wants to know what she needs to do.  Please advise.    Martha Cabrera cell # [phone]

## 2015-01-31 NOTE — Telephone Encounter (Signed)
She would need to have very close contact to get anything from this coworker such as close physical contact like same house/ same bed. Nothing to do just wait and see. If anything, she would get chicken pox and not shingles. She already has had the chicken pox vaccine. Titer not indicated in this situation as it is not a life threatening situation and she is healthy.

## 2015-01-31 NOTE — Telephone Encounter (Signed)
Patient notified

## 2015-02-15 ENCOUNTER — Ambulatory Visit
Admission: RE | Admit: 2015-02-15 | Discharge: 2015-02-15 | Disposition: A | Discharge status: Home / Self Care | Payer: PRIVATE HEALTH INSURANCE | Source: Ambulatory Visit

## 2015-02-15 DIAGNOSIS — Z1231 Encounter for screening mammogram for malignant neoplasm of breast: Secondary | ICD-10-CM

## 2015-02-25 ENCOUNTER — Encounter: Payer: Self-pay | Admitting: Internal Medicine

## 2015-02-25 ENCOUNTER — Ambulatory Visit (INDEPENDENT_AMBULATORY_CARE_PROVIDER_SITE_OTHER): Payer: Commercial Managed Care - PPO | Admitting: Internal Medicine

## 2015-02-25 VITALS — BP 100/60 | HR 77 | Temp 97.9°F | Resp 20 | Ht 68.0 in | Wt 132.0 lb

## 2015-02-25 DIAGNOSIS — R300 Dysuria: Secondary | ICD-10-CM

## 2015-02-25 DIAGNOSIS — R8290 Unspecified abnormal findings in urine: Secondary | ICD-10-CM

## 2015-02-25 LAB — POCT URINALYSIS DIPSTICK
BILIRUBIN UA: NEGATIVE
GLUCOSE UA: NEGATIVE
Ketones, UA: NEGATIVE
NITRITE UA: NEGATIVE
Protein, UA: NEGATIVE
Spec Grav, UA: 1.02
UROBILINOGEN UA: 0.2
pH, UA: 6.5

## 2015-02-25 MED ORDER — CIPROFLOXACIN HCL 500 MG PO TABS: 500.0000 mg | ORAL_TABLET | Freq: Two times a day (BID) | ORAL | Status: DC

## 2015-02-25 NOTE — Patient Instructions (Signed)
Cipro 500 mg twice daily for 7 days. Urine culture pending. After completing Cipro treatment use Septra DS after intercourse as needed.

## 2015-02-25 NOTE — Progress Notes (Signed)
   Subjective:    Patient ID: Martha Cabrera, female    DOB: 01-02-1961, 55 y.o.   MRN: [id number]  HPI  Has come down with another urinary tract infection. Up until recently has never had issues with UTIs. No fever or shaking chills. No back pain. No nausea and vomiting. Urinalysis is abnormal by dipstick. Culture sent.   Sexually active with husband about once a week. Urinates after intercourse.    Review of Systems     Objective:   Physical Exam    No CVA tenderness. Urine sent for culture     Assessment & Plan:   acute UTI  Plan: Patient improved last time with Cipro. On October 27, had Escherichia coli UTI sensitive to Cipro. This is her second UTI in a short time. She will take Cipro 500 mg twice daily for 7 days. After that, she will try Septra DS after intercourse. Given prescription for #15 with 1 refill.

## 2015-02-27 LAB — CULTURE, URINE COMPREHENSIVE: Colony Count: >=100000

## 2015-04-18 ENCOUNTER — Telehealth: Payer: Self-pay | Admitting: Internal Medicine

## 2015-04-18 DIAGNOSIS — T753XXS Motion sickness, sequela: Secondary | ICD-10-CM

## 2015-04-18 MED ORDER — SCOPOLAMINE 1 MG/3DAYS TD PT72: 1.0000 | MEDICATED_PATCH | TRANSDERMAL | Status: DC

## 2015-04-18 NOTE — Addendum Note (Signed)
Addended by: Dierdre Forth on: 04/18/2015 12:45 PM   Modules accepted: Orders

## 2015-04-18 NOTE — Telephone Encounter (Signed)
She and her family are leaving next Monday, 3/6 for [place].  They'll be going deep sea fishing.  She is wanting to know if you'll write a Rx for them for the patches to prevent them from getting motion sickness.  She doesn't want to use the Dramamine because it will make them sleepy.  She would prefer the patch.    Pharmacy:  [facility] [address].

## 2015-04-18 NOTE — Telephone Encounter (Signed)
Patient notified. She contacted the pediatrician for the children' and advised her husband to contact his PCP.

## 2015-04-18 NOTE — Telephone Encounter (Signed)
I can only prescribe for her since she is the only patient here. Rx Transderm scop transdermal patches. Apply to skin q 3 days one box

## 2015-06-07 ENCOUNTER — Ambulatory Visit (INDEPENDENT_AMBULATORY_CARE_PROVIDER_SITE_OTHER): Payer: No Typology Code available for payment source | Admitting: Internal Medicine

## 2015-06-07 ENCOUNTER — Encounter: Payer: Self-pay | Admitting: Internal Medicine

## 2015-06-07 VITALS — BP 118/64 | HR 84 | Temp 97.9°F | Resp 20 | Ht 67.5 in | Wt 137.5 lb

## 2015-06-07 DIAGNOSIS — Z8669 Personal history of other diseases of the nervous system and sense organs: Secondary | ICD-10-CM

## 2015-06-07 DIAGNOSIS — Z658 Other specified problems related to psychosocial circumstances: Secondary | ICD-10-CM | POA: Diagnosis not present

## 2015-06-07 DIAGNOSIS — G43011 Migraine without aura, intractable, with status migrainosus: Secondary | ICD-10-CM

## 2015-06-07 DIAGNOSIS — F439 Reaction to severe stress, unspecified: Secondary | ICD-10-CM

## 2015-06-07 MED ORDER — SUMATRIPTAN SUCCINATE 100 MG PO TABS: 100.0000 mg | ORAL_TABLET | ORAL | Status: DC

## 2015-06-07 MED ORDER — PREDNISONE 10 MG PO TABS: ORAL_TABLET | ORAL | Status: DC

## 2015-06-11 ENCOUNTER — Encounter: Payer: Self-pay | Admitting: Internal Medicine

## 2015-06-11 DIAGNOSIS — Z8669 Personal history of other diseases of the nervous system and sense organs: Secondary | ICD-10-CM | POA: Insufficient documentation

## 2015-06-11 NOTE — Patient Instructions (Signed)
Take prednisone in tapering course as directed. Have Imitrex on hand for subsequent migraine headaches. Call if not better in 24 hours or sooner if worse.

## 2015-06-11 NOTE — Progress Notes (Signed)
   Subjective:    Patient ID: Carlisle BeersMarian B Vasconcelos, female    DOB: 27-Jan-1961, 55 y.o.   MRN: [id number]  HPI  55 year old Female in today with migraine headache. She's had a stressful situation with her son who is at [employer] in [place]. Has had nausea and throbbing headache consistent with migraine. Headache present for a couple of days. No significant visual disturbance. Remote history of migraine headaches in the past. Discussed situational stress at length which likely triggered her migraine.    Review of Systems     Objective:   Physical Exam She has no focal deficits on brief neurological exam. Funduscopic exam is benign. Chest clear. Cardiac exam regular rate and rhythm. Extraocular movements are full. PERRLA. No extremity weakness. Alert and oriented 3. Face is slightly pale. Skin warm and dry. Speech is normal. No facial weakness.       Assessment & Plan:  Spent 25 minutes with patient and speaking with her about situational stress and management of migraine headaches. She does not want narcotic pain medication at this point time were going to try tapering dose of prednisone going from 60 mg 0 mg over 7 days. Prescribed Imitrex 100 mg tablets to have on hand for subsequent migraine headaches if needed

## 2015-11-25 ENCOUNTER — Ambulatory Visit (INDEPENDENT_AMBULATORY_CARE_PROVIDER_SITE_OTHER): Payer: No Typology Code available for payment source | Admitting: Internal Medicine

## 2015-11-25 ENCOUNTER — Encounter: Payer: Self-pay | Admitting: Internal Medicine

## 2015-11-25 VITALS — BP 98/60 | HR 70 | Temp 98.7°F | Wt 131.5 lb

## 2015-11-25 DIAGNOSIS — N390 Urinary tract infection, site not specified: Secondary | ICD-10-CM | POA: Diagnosis not present

## 2015-11-25 DIAGNOSIS — R300 Dysuria: Secondary | ICD-10-CM

## 2015-11-25 DIAGNOSIS — Z960 Presence of urogenital implants: Secondary | ICD-10-CM

## 2015-11-25 DIAGNOSIS — Z9889 Other specified postprocedural states: Secondary | ICD-10-CM

## 2015-11-25 LAB — POCT URINALYSIS DIPSTICK
Bilirubin, UA: NEGATIVE
GLUCOSE UA: NEGATIVE
Ketones, UA: NEGATIVE
Nitrite, UA: POSITIVE
Protein, UA: NEGATIVE
Spec Grav, UA: <=1.005
UROBILINOGEN UA: NEGATIVE
pH, UA: 7.5

## 2015-11-25 MED ORDER — CIPROFLOXACIN HCL 500 MG PO TABS: 500.0000 mg | ORAL_TABLET | Freq: Two times a day (BID) | ORAL | 0 refills | Status: DC

## 2015-11-25 NOTE — Patient Instructions (Addendum)
Cipro 500 mg  twice daily x 10 days . Follow up with urine check in 2 weeks.

## 2015-11-25 NOTE — Progress Notes (Signed)
   Subjective:    Patient ID: Carlisle BeersMarian B Urieta, female    DOB: 1960/03/08, 55 y.o.   MRN: [id number]  HPI   Onset Monday with dysuria. Last UTI was treated by GYN a few months ago perhaps May.  Felt Ok yesterday but symptoms  Returned this am.  No back pain.    Review of Systems no back pain nausea or vomiting     Objective:   Physical Exam  No CVA tenderness  C dipstick UA results. 2+ LE, positive nitrite, large occult blood Culture ordered. Has not seen hematuria.      Assessment & Plan:  Acute UTI  Plan: She does have history of vaginal sling done by GYN. This is fourth UTI and proximally one year. She used to never had these. Culture ordered. Start Cipro 500 mg twice daily for 10 days with plans to follow-up UA in 2 weeks. Consider urology consult if continues to have frequent UTIs.

## 2015-11-27 LAB — URINE CULTURE

## 2015-11-29 ENCOUNTER — Telehealth: Payer: Self-pay | Admitting: Internal Medicine

## 2015-11-29 NOTE — Telephone Encounter (Signed)
Patient has been experiencing tingling in her left arm since starting the medication she was prescribed last Friday.  Please advise.

## 2015-11-29 NOTE — Telephone Encounter (Signed)
Should not be related. Urine culture no growth can stop med after 5 days

## 2015-12-01 ENCOUNTER — Telehealth: Payer: Self-pay | Admitting: Internal Medicine

## 2015-12-01 NOTE — Telephone Encounter (Signed)
Patient calls with complaints of weak, dizzy, light-headed.  States that she did stop the Cipro, but was on day 6 of the 10 day pack.  She's never had any trouble with Cipro before.  States that she did take an Imitrex Tuesday night and Wednesday trying to aide with a migraine.  She's unsure if perhaps there could've been any drug interaction between these 2 medications or not??    Dr. Lenord FellersBaxley advised she would be happy to see patient on Friday.  Offered patient appointment on Friday, 10/13.  Patient advised that her husband had picked her up from work today and [facility] were [facility].  She will call back if she decides she wants to be seen tomorrow, 10/13.

## 2016-01-10 ENCOUNTER — Telehealth: Payer: Self-pay | Admitting: Internal Medicine

## 2016-01-10 MED ORDER — SUMATRIPTAN SUCCINATE 100 MG PO TABS: 100.0000 mg | ORAL_TABLET | ORAL | 3 refills | Status: DC

## 2016-01-10 NOTE — Telephone Encounter (Signed)
Please refill.

## 2016-01-10 NOTE — Telephone Encounter (Signed)
Patient states she is leaving to go out of town tomorrow and wants to know if you would mind to call in a refill on her Imitrex 100mg ?  She feels like she has a migraine coming on.   Thank you.     Pharmacy:  [facility]([place])

## 2016-01-10 NOTE — Telephone Encounter (Signed)
Imitrex sent to pharmacy per Dr Lenord FellersBaxley order.

## 2016-03-21 ENCOUNTER — Other Ambulatory Visit: Payer: Self-pay | Admitting: Obstetrics

## 2016-03-21 DIAGNOSIS — Z1231 Encounter for screening mammogram for malignant neoplasm of breast: Secondary | ICD-10-CM

## 2016-04-09 ENCOUNTER — Encounter: Payer: Self-pay | Admitting: Internal Medicine

## 2016-04-09 ENCOUNTER — Ambulatory Visit (INDEPENDENT_AMBULATORY_CARE_PROVIDER_SITE_OTHER): Payer: No Typology Code available for payment source | Admitting: Internal Medicine

## 2016-04-09 VITALS — BP 102/70 | HR 62 | Temp 98.2°F | Ht 68.25 in | Wt 130.3 lb

## 2016-04-09 DIAGNOSIS — J111 Influenza due to unidentified influenza virus with other respiratory manifestations: Secondary | ICD-10-CM | POA: Diagnosis not present

## 2016-04-09 DIAGNOSIS — B349 Viral infection, unspecified: Secondary | ICD-10-CM | POA: Diagnosis not present

## 2016-04-09 MED ORDER — AZITHROMYCIN 250 MG PO TABS: ORAL_TABLET | ORAL | 0 refills | Status: DC

## 2016-04-09 MED ORDER — OSELTAMIVIR PHOSPHATE 75 MG PO CAPS: 75.0000 mg | ORAL_CAPSULE | Freq: Two times a day (BID) | ORAL | 0 refills | Status: DC

## 2016-04-09 NOTE — Patient Instructions (Signed)
Tamiflu 75 mg twice daily for 5 days. If cough persist or gets worse start Zithromax Z-PAK as directed. Rest and drink plenty of fluids.

## 2016-04-09 NOTE — Progress Notes (Signed)
   Subjective:    Patient ID: Martha Cabrera, female    DOB: Jul 14, 1960, 56 y.o.   MRN: [id number]  HPI 56 year old Female who awakened today with a headache that was throbbing. She does have a history of migraine headaches but this was unusual for her. She had one loose stool in small pieces. No nausea and vomiting. Son was treated for the flu last weekend some 8 days ago. He definitely had flulike symptoms but did not receive a flu test. She is concerned she may be developing the flu. She has no sore throat. Currently no myalgias and headache improved after she took Imitrex midday. Ibuprofen did not help the headache originally. She has had some dry cough.    Review of Systems see above     Objective:   Physical Exam Skin warm and dry. Pharynx very slightly injected without exudate. TMs obscured by cerumen bilaterally. Neck is supple without adenopathy. Chest clear to auscultation without rales or wheezing       Assessment & Plan:  Probable influenza-symptoms may not have completely developed. She certainly had flu exposure. Did have flu vaccine elsewhere.  Plan: Tamiflu 75 mg twice daily for 5 days. If she develops worsening cough is to take Zithromax Z-PAK  2 tablets day one followed by 1 tablet days 2 through 5. Rest and drink plenty of fluids.

## 2016-04-20 ENCOUNTER — Ambulatory Visit
Admission: RE | Admit: 2016-04-20 | Discharge: 2016-04-20 | Disposition: A | Discharge status: Home / Self Care | Payer: PRIVATE HEALTH INSURANCE | Source: Ambulatory Visit | Attending: Obstetrics | Admitting: Obstetrics

## 2016-04-20 DIAGNOSIS — Z1231 Encounter for screening mammogram for malignant neoplasm of breast: Secondary | ICD-10-CM

## 2017-05-14 ENCOUNTER — Ambulatory Visit (INDEPENDENT_AMBULATORY_CARE_PROVIDER_SITE_OTHER): Payer: No Typology Code available for payment source | Admitting: Internal Medicine

## 2017-05-14 ENCOUNTER — Encounter: Payer: Self-pay | Admitting: Internal Medicine

## 2017-05-14 ENCOUNTER — Ambulatory Visit
Admission: RE | Admit: 2017-05-14 | Discharge: 2017-05-14 | Disposition: A | Discharge status: Home / Self Care | Payer: PRIVATE HEALTH INSURANCE | Source: Ambulatory Visit | Attending: Internal Medicine | Admitting: Internal Medicine

## 2017-05-14 VITALS — BP 110/70 | HR 77 | Temp 98.1°F | Wt 136.0 lb

## 2017-05-14 DIAGNOSIS — R079 Chest pain, unspecified: Secondary | ICD-10-CM | POA: Diagnosis not present

## 2017-05-14 MED ORDER — BUDESONIDE-FORMOTEROL FUMARATE 160-4.5 MCG/ACT IN AERO: 2.0000 | INHALATION_SPRAY | Freq: Two times a day (BID) | RESPIRATORY_TRACT | 0 refills | Status: DC

## 2017-05-14 NOTE — Progress Notes (Signed)
   Subjective:    Patient ID: Martha Cabrera, female    DOB: Sep 18, 1960, 57 y.o.   MRN: [id number]  HPI Onset late last week, pressure in chest without significant radiation.  No diaphoresis.  She thought it might be related to a respiratory infection.  Pressure was in her upper chest area and throat.  Patient is wondering if it could be anxiety.  She takes no chronic medications.  She is married with 2 children.  Does not smoke.  Social alcohol consumption.  Her general health is good.      Review of Systems see above -feels a bit tight to breathe     Objective:   Physical Exam Skin warm and dry.  Nodes none.  Neck is supple without JVD thyromegaly or carotid bruits.  Chest clear to auscultation without rales or wheezing.  Extremities without edema.       Assessment & Plan:  Chest pain at rest with normal EKG.  To have chest x-ray.  Addendum: Chest x-ray is negative.  Try Symbicort inhaler 160/4.52 sprays p.o. twice daily.  Call if symptoms fail to improve or worsen.

## 2017-05-18 NOTE — Patient Instructions (Addendum)
Please call if symptoms fail to improve or worsen.EKG is within normal limits.  Try Symbicort inhaler 2 sprays p.o. every 12 hours.  Free sample provided.

## 2017-05-24 ENCOUNTER — Other Ambulatory Visit: Payer: Self-pay | Admitting: Obstetrics

## 2017-05-24 DIAGNOSIS — Z1231 Encounter for screening mammogram for malignant neoplasm of breast: Secondary | ICD-10-CM

## 2017-06-05 ENCOUNTER — Telehealth: Payer: Self-pay | Admitting: Internal Medicine

## 2017-06-05 MED ORDER — SUMATRIPTAN SUCCINATE 100 MG PO TABS: 100.0000 mg | ORAL_TABLET | ORAL | 3 refills | Status: DC

## 2017-06-05 NOTE — Telephone Encounter (Signed)
Refill Sumatriptan for one year

## 2017-06-05 NOTE — Telephone Encounter (Signed)
Done

## 2017-06-05 NOTE — Telephone Encounter (Signed)
[name] [phone]  [facility]  Armando ReichertMarian stop by to say she is completely out of her Sumatriptan and needs a refill, she is going out of town and would like to have some on hand just incase she needs it.

## 2017-06-25 ENCOUNTER — Ambulatory Visit
Admission: RE | Admit: 2017-06-25 | Discharge: 2017-06-25 | Disposition: A | Discharge status: Home / Self Care | Payer: PRIVATE HEALTH INSURANCE | Source: Ambulatory Visit | Attending: Obstetrics | Admitting: Obstetrics

## 2017-06-25 DIAGNOSIS — Z1231 Encounter for screening mammogram for malignant neoplasm of breast: Secondary | ICD-10-CM

## 2017-07-02 ENCOUNTER — Ambulatory Visit (INDEPENDENT_AMBULATORY_CARE_PROVIDER_SITE_OTHER): Payer: No Typology Code available for payment source | Admitting: Internal Medicine

## 2017-07-02 VITALS — BP 92/62 | HR 70 | Temp 97.9°F | Ht 68.5 in | Wt 135.0 lb

## 2017-07-02 DIAGNOSIS — R8290 Unspecified abnormal findings in urine: Secondary | ICD-10-CM | POA: Diagnosis not present

## 2017-07-02 DIAGNOSIS — R1030 Lower abdominal pain, unspecified: Secondary | ICD-10-CM

## 2017-07-02 DIAGNOSIS — N390 Urinary tract infection, site not specified: Secondary | ICD-10-CM | POA: Diagnosis not present

## 2017-07-02 DIAGNOSIS — R300 Dysuria: Secondary | ICD-10-CM | POA: Diagnosis not present

## 2017-07-02 LAB — POCT URINALYSIS DIPSTICK
Bilirubin, UA: NEGATIVE
GLUCOSE UA: NEGATIVE
Ketones, UA: NEGATIVE
Nitrite, UA: NEGATIVE
Protein, UA: NEGATIVE
SPEC GRAV UA: 1.01 (ref 1.010–1.025)
Urobilinogen, UA: 0.2 E.U./dL
pH, UA: 6.5 (ref 5.0–8.0)

## 2017-07-02 MED ORDER — CIPROFLOXACIN HCL 500 MG PO TABS: 500.0000 mg | ORAL_TABLET | Freq: Two times a day (BID) | ORAL | 1 refills | Status: DC

## 2017-07-02 NOTE — Progress Notes (Signed)
   Subjective:    Patient ID: Martha Cabrera, female    DOB: 08-Feb-1961, 57 y.o.   MRN: [id number]  HPI Recent onset within the past day or so of urinary frequency and dysuria.  No fever or shaking chills.  No nausea or vomiting.  She does have history of urinary tract infections last one in October 2017.  Usually responds well with Cipro.    Review of Systems     Objective:   Physical Exam  No CVA tenderness.  Dipstick UA shows small LV.  Culture was sent.  Moderate occult blood noted.      Assessment & Plan:  Acute UTI  Plan: Cipro 500 mg twice daily for 10 days.  Addendum: Urine culture shows E. coli greater than 100,000 colonies per milliliter sensitive to Cipro which she is on

## 2017-07-04 LAB — URINE CULTURE
MICRO NUMBER: 90586033
SPECIMEN QUALITY:: ADEQUATE

## 2017-07-15 ENCOUNTER — Encounter: Payer: Self-pay | Admitting: Internal Medicine

## 2017-07-15 NOTE — Patient Instructions (Signed)
Cipro 500 mg twice daily for 10 days with urine culture pending.

## 2017-08-05 ENCOUNTER — Encounter: Payer: Self-pay | Admitting: Internal Medicine

## 2017-08-05 ENCOUNTER — Telehealth: Payer: Self-pay | Admitting: Internal Medicine

## 2017-08-05 NOTE — Telephone Encounter (Addendum)
Patient is taking a trip to [place] next week.  I checked her chart and she has a refill on Cipro for UTI.  She has refills on Imitrex for her migraines.  She would like to know if you will call in some Xanax or Ambien or something to help her sleep on the plane going and coming home since this is such a long flight?  She has never used Ambien before and knows that is controversial.  So, she wanted you to make that call.  She is fine with Xanax if you think that is safer for her to use.    Pharmacy:  [facility] [address].   Thank you.   Phone:  [phone] (you only need to call her back IF you are not going to be able to call something in for her)  Thanks.   Call in Xanax 0.5 mg # 30  One or two po q 12 hours as needed for international travel and sleep

## 2017-08-06 ENCOUNTER — Ambulatory Visit (INDEPENDENT_AMBULATORY_CARE_PROVIDER_SITE_OTHER): Payer: No Typology Code available for payment source | Admitting: Internal Medicine

## 2017-08-06 ENCOUNTER — Encounter: Payer: Self-pay | Admitting: Internal Medicine

## 2017-08-06 VITALS — BP 102/60 | HR 70 | Temp 98.2°F | Ht 68.5 in | Wt 136.0 lb

## 2017-08-06 DIAGNOSIS — F411 Generalized anxiety disorder: Secondary | ICD-10-CM | POA: Diagnosis not present

## 2017-08-06 DIAGNOSIS — Z7184 Encounter for health counseling related to travel: Secondary | ICD-10-CM

## 2017-08-06 DIAGNOSIS — Z7189 Other specified counseling: Secondary | ICD-10-CM | POA: Diagnosis not present

## 2017-08-06 DIAGNOSIS — R002 Palpitations: Secondary | ICD-10-CM

## 2017-08-06 LAB — CBC WITH DIFFERENTIAL/PLATELET
Basophils Absolute: 58 {cells}/uL (ref 0–200)
Basophils Relative: 0.8 %
Eosinophils Absolute: 209 {cells}/uL (ref 15–500)
Eosinophils Relative: 2.9 %
HCT: 42 % (ref 35.0–45.0)
Hemoglobin: 14.4 g/dL (ref 11.7–15.5)
Lymphs Abs: 1886 {cells}/uL (ref 850–3900)
MCH: 30 pg (ref 27.0–33.0)
MCHC: 34.3 g/dL (ref 32.0–36.0)
MCV: 87.5 fL (ref 80.0–100.0)
MPV: 10.8 fL (ref 7.5–12.5)
Monocytes Relative: 9.3 %
Neutro Abs: 4378 {cells}/uL (ref 1500–7800)
Neutrophils Relative %: 60.8 %
Platelets: 250 Thousand/uL (ref 140–400)
RBC: 4.8 Million/uL (ref 3.80–5.10)
RDW: 12 % (ref 11.0–15.0)
Total Lymphocyte: 26.2 %
WBC mixed population: 670 {cells}/uL (ref 200–950)
WBC: 7.2 Thousand/uL (ref 3.8–10.8)

## 2017-08-06 LAB — COMPLETE METABOLIC PANEL WITH GFR
AG Ratio: 1.6 (calc) (ref 1.0–2.5)
ALT: 15 U/L (ref 6–29)
AST: 19 U/L (ref 10–35)
Albumin: 4.4 g/dL (ref 3.6–5.1)
Alkaline phosphatase (APISO): 65 U/L (ref 33–130)
BUN: 20 mg/dL (ref 7–25)
CO2: 31 mmol/L (ref 20–32)
CREATININE: 0.76 mg/dL (ref 0.50–1.05)
Calcium: 9.7 mg/dL (ref 8.6–10.4)
Chloride: 100 mmol/L (ref 98–110)
GFR, EST AFRICAN AMERICAN: 102 mL/min/{1.73_m2} (ref >=60)
GFR, EST NON AFRICAN AMERICAN: 88 mL/min/{1.73_m2} (ref >=60)
Globulin: 2.7 g/dL (calc) (ref 1.9–3.7)
Glucose, Bld: 83 mg/dL (ref 65–99)
Potassium: 4.8 mmol/L (ref 3.5–5.3)
Sodium: 139 mmol/L (ref 135–146)
TOTAL PROTEIN: 7.1 g/dL (ref 6.1–8.1)
Total Bilirubin: 0.4 mg/dL (ref 0.2–1.2)

## 2017-08-06 MED ORDER — ALPRAZOLAM 0.5 MG PO TABS: ORAL_TABLET | ORAL | 0 refills | Status: DC

## 2017-08-06 NOTE — Progress Notes (Signed)
   Subjective:    Patient ID: Martha Cabrera, female    DOB: 1960/03/15, 57 y.o.   MRN: [id number]  HPI She is here for evaluation of possible irregular rhythm.  Had normal EKG March 2019 when seen for complaint of pressure in chest. EKG was normal. CXR was negative. Was given Symbicort inhaler.  Married with 2 children. Does not smoke. Social alcohol consumption.  FHx: Father with HTN,DM,Hyperlipidemia and Crohn's disease  Leaving June 27 for trip to [place] and will be back July 12.  Yesterday felt chest discomfort and thought she might in atrial fib. She went to  [facility] and EKG was done showing sinus rhythm. She brings in a copy of this EKG today.  Says she has had busy time. Son has recently graduated from high school and she entertained for him. She and her husband have been going to counseling. Trip to [place] upcoming soon. She seems little anxious about trip and is asking for sleep med for plane travel.  Review of Systems see above. No diaphoresis nausea or vomiting.     Objective:   Physical Exam  Skin warm and dry.  Nodes none.  Neck is supple.  No thyromegaly or JVD.  No carotid bruits.  Cardiac exam reveals regular rate and rhythm normal S1 and S2 without murmurs or gallops.  No clicks are appreciated.  Extremities without edema.  She seems a bit anxious.      Assessment & Plan:  Complaint of palpitations and irregular pulse.  EKG yesterday showed normal sinus rhythm.  Rhythm is normal today.  EKG was normal in March.  Consider 2D echo to rule out mitral valve prolapse.  With upcoming trip to [place] I would like for her to be seen by Cardiologist prior to her departure.  ?  Anxiety about upcoming trip and some situational stress  She had TSH done at [facility] walk-in clinic yesterday which was normal.  Drew CBC and C met today  With regard to travel I think  it would be prudent for her to take Cipro and some Xanax with her.  Xanax has been filled 0.5 mg 1 or 2  tablets p.o. every 12 hours as needed international travel and sleep.  Cipro 500 mg twice daily for 10 days if needed.

## 2017-08-08 MED ORDER — CIPROFLOXACIN HCL 500 MG PO TABS: 500.0000 mg | ORAL_TABLET | Freq: Two times a day (BID) | ORAL | 1 refills | Status: DC

## 2017-08-08 NOTE — Patient Instructions (Signed)
Please see Cardiologist next week for evaluation prior to your trip to [place].  Xanax filled for international travel as well as Cipro.  CBC and C met drawn today.

## 2017-08-09 ENCOUNTER — Encounter: Payer: Self-pay | Admitting: Cardiology

## 2017-08-09 ENCOUNTER — Ambulatory Visit (INDEPENDENT_AMBULATORY_CARE_PROVIDER_SITE_OTHER): Payer: PRIVATE HEALTH INSURANCE | Admitting: Cardiology

## 2017-08-09 DIAGNOSIS — R079 Chest pain, unspecified: Secondary | ICD-10-CM | POA: Diagnosis not present

## 2017-08-09 DIAGNOSIS — R002 Palpitations: Secondary | ICD-10-CM | POA: Insufficient documentation

## 2017-08-09 DIAGNOSIS — Z13220 Encounter for screening for lipoid disorders: Secondary | ICD-10-CM | POA: Diagnosis not present

## 2017-08-09 NOTE — Progress Notes (Signed)
Cardiology Office Note:    Date:  08/09/2017   ID:  Martha Cabrera, DOB February 05, 1961, MRN [id number]  PCP:  Margaree MackintoshBaxley, Mary J, MD  Cardiologist:  Garwin Brothersajan R Jodee Wagenaar, MD   Referring MD: Margaree MackintoshBaxley, Mary J, MD    ASSESSMENT:    1. Chest pain, unspecified type   2. Palpitations    PLAN:    In order of problems listed above:  1. Primary prevention stressed with the patient.  Importance of compliance with diet and medications stressed and she vocalized understanding. 2. I want to get her back in the next few days for liver lipid check.  I do not see one in the chart and this is for risk stratification. 3. Her chest pain is atypical for coronary etiology and to reassure her I will get an exercise treadmill stress test.  48-hour Holter monitoring will be done to assess palpitations.  She will also get a TSH to assess palpitations. 4. She will be seen in follow-up appointment in a month or earlier if she has any concerns she knows to go to the nearest emergency room for any significant problems.    Medication Adjustments/Labs and Tests Ordered: Current medicines are reviewed at length with the patient today.  Concerns regarding medicines are outlined above.  No orders of the defined types were placed in this encounter.  No orders of the defined types were placed in this encounter.    History of Present Illness:    Martha Cabrera is a 57 y.o. female who is being seen today for the evaluation of palpitations and chest pain at the request of Baxley, Luanna ColeMary J, MD.  Patient is a pleasant 57 year old female.  She has past medical history that is not much significant.  The patient mentions to me that she occasionally has chest tightness not related to exertion.  She is going to the gym regularly with a physical trainer and has good workout without any problems.  No chest pain orthopnea palpitations or any other symptoms during her training.  She says that she feels a sensation of palpitations and  probably skipped beats.  This is what concerned her and want to be evaluated.  She is planning to go to on a trip internationally [place].  At the time of my evaluation, the patient is alert awake oriented and in no distress.  History reviewed. No pertinent past medical history.  Past Surgical History:  Procedure Laterality Date  . AUGMENTATION MAMMAPLASTY  1989   RETRO PECTORAL SALINE  . BLADDER SUSPENSION    . BREAST ENHANCEMENT SURGERY     1990  . BREAST SURGERY  1990   Breast Augmentation    Current Medications: Current Meds  Medication Sig  . ALPRAZolam (XANAX) 0.5 MG tablet One  or two tabs po q 12 hours prn international travel and sleep  . CALCIUM PO Take by mouth daily.  . Cholecalciferol (VITAMIN D PO) Take 1,200 Units by mouth daily.   . ciprofloxacin (CIPRO) 500 MG tablet Take 1 tablet (500 mg total) by mouth 2 (two) times daily.  Marland Kitchen. levonorgestrel (MIRENA) 20 MCG/24HR IUD 1 each by Intrauterine route once.  . SUMAtriptan (IMITREX) 100 MG tablet Take 1 tablet (100 mg total) by mouth See admin instructions. (Patient taking differently: Take 100 mg by mouth as needed. )  . [DISCONTINUED] MAGNESIUM PO Take by mouth daily.     Allergies:   Patient has no known allergies.  Social History   Socioeconomic History  . Marital status: Married    Spouse name: Not on file  . Number of children: Not on file  . Years of education: Not on file  . Highest education level: Not on file  Occupational History  . Not on file  Social Needs  . Financial resource strain: Not on file  . Food insecurity:    Worry: Not on file    Inability: Not on file  . Transportation needs:    Medical: Not on file    Non-medical: Not on file  Tobacco Use  . Smoking status: Former Smoker    Years: 2.00    Types: Cigarettes    Last attempt to quit: 10/09/1977    Years since quitting: 39.8  . Smokeless tobacco: Never Used  Substance and Sexual Activity  . Alcohol  use: Yes    Alcohol/week: 0.6 - 1.2 oz    Types: 1 - 2 Glasses of wine per week    Comment: 1-2 glasses a wk or less  . Drug use: No  . Sexual activity: Yes    Birth control/protection: IUD  Lifestyle  . Physical activity:    Days per week: Not on file    Minutes per session: Not on file  . Stress: Not on file  Relationships  . Social connections:    Talks on phone: Not on file    Gets together: Not on file    Attends religious service: Not on file    Active member of club or organization: Not on file    Attends meetings of clubs or organizations: Not on file    Relationship status: Not on file  Other Topics Concern  . Not on file  Social History Narrative  . Not on file     Family History: The patient's family history includes Crohn's disease in her father; Depression in her mother; Diabetes in her father; Hyperlipidemia in her father; Hypertension in her brother and father. There is no history of Colon cancer, Esophageal cancer, or Stomach cancer.  ROS:   Please see the history of present illness.    All other systems reviewed and are negative.  EKGs/Labs/Other Studies Reviewed:    The following studies were reviewed today: EKG reveals sinus rhythm and nonspecific ST-T changes.   Recent Labs: 08/06/2017: ALT 15; BUN 20; Creat 0.76; Hemoglobin 14.4; Platelets 250; Potassium 4.8; Sodium 139  Recent Lipid Panel No results found for: CHOL, TRIG, HDL, CHOLHDL, VLDL, LDLCALC, LDLDIRECT  Physical Exam:    VS:  BP 118/68 (BP Location: Left Arm, Patient Position: Sitting, Cuff Size: Normal)   Pulse 76   Ht 5' 8.5" (1.74 m)   Wt 133 lb 12.8 oz (60.7 kg)   SpO2 98%   BMI 20.05 kg/m     Wt Readings from Last 3 Encounters:  08/09/17 133 lb 12.8 oz (60.7 kg)  08/06/17 136 lb (61.7 kg)  07/02/17 135 lb (61.2 kg)     GEN: Patient is in no acute distress HEENT: Normal NECK: No JVD; No carotid bruits LYMPHATICS: No lymphadenopathy CARDIAC: S1 S2 regular, 2/6 systolic  murmur at the apex. RESPIRATORY:  Clear to auscultation without rales, wheezing or rhonchi  ABDOMEN: Soft, non-tender, non-distended MUSCULOSKELETAL:  No edema; No deformity  SKIN: Warm and dry NEUROLOGIC:  Alert and oriented x 3 PSYCHIATRIC:  Normal affect    Signed, Garwin Brothers, MD  08/09/2017 9:38 AM    [facility]

## 2017-08-09 NOTE — Patient Instructions (Addendum)
Medication Instructions:  Your physician recommends that you continue on your current medications as directed. Please refer to the Current Medication list given to you today.  Labwork: Your physician recommends that you have the following labs drawn: Please return to the office fasting (only water) for a TSH, liver and lipid check. An appointment is not necessary you may walk in between the hours of 8:00 am - 4:45 pm.  Testing/Procedures: Your physician has requested that you have an exercise tolerance test. For further information please visit [email or web address]. Please also follow instruction sheet, as given.  Your physician has recommended that you wear a holter monitor. Holter monitors are medical devices that record the heart's electrical activity. Doctors most often use these monitors to diagnose arrhythmias. Arrhythmias are problems with the speed or rhythm of the heartbeat. The monitor is a small, portable device. You can wear one while you do your normal daily activities. This is usually used to diagnose what is causing palpitations/syncope (passing out).  Follow-Up: Your physician recommends that you schedule a follow-up appointment in: 1 month  Any Other Special Instructions Will Be Listed Below (If Applicable).     If you need a refill on your cardiac medications before your next appointment, please call your pharmacy.   [facility]  Garey HamAshley A, RN, BSN   Holter Monitoring A Holter monitor is a small device that is used to detect abnormal heart rhythms. It clips to your clothing and is connected by wires to flat, sticky disks (electrodes) that attach to your chest. It is worn continuously for 24-48 hours. Follow these instructions at home:  Wear your Holter monitor at all times, even while exercising and sleeping, for as long as directed by your health care provider.  Make sure that the Holter monitor is safely clipped to your clothing or close to your body as  recommended by your health care provider.  Do not get the monitor or wires wet.  Do not put body lotion or moisturizer on your chest.  Keep your skin clean.  Keep a diary of your daily activities, such as walking and doing chores. If you feel that your heartbeat is abnormal or that your heart is fluttering or skipping a beat: ? Record what you are doing when it happens. ? Record what time of day the symptoms occur.  Return your Holter monitor as directed by your health care provider.  Keep all follow-up visits as directed by your health care provider. This is important. Get help right away if:  You feel lightheaded or you faint.  You have trouble breathing.  You feel pain in your chest, upper arm, or jaw.  You feel sick to your stomach and your skin is pale, cool, or damp.  You heartbeat feels unusual or abnormal. This information is not intended to replace advice given to you by your health care provider. Make sure you discuss any questions you have with your health care provider. Document Released: 11/04/2003 Document Revised: 07/14/2015 Document Reviewed: 09/14/2013 Elsevier Interactive Patient Education  [employer]2018 Elsevier Inc.

## 2017-08-12 ENCOUNTER — Ambulatory Visit: Payer: PRIVATE HEALTH INSURANCE

## 2017-08-12 ENCOUNTER — Ambulatory Visit: Payer: PRIVATE HEALTH INSURANCE | Admitting: Cardiology

## 2017-08-12 DIAGNOSIS — R002 Palpitations: Secondary | ICD-10-CM

## 2017-08-13 ENCOUNTER — Telehealth: Payer: Self-pay

## 2017-08-13 LAB — TSH: TSH: 1.73 u[IU]/mL (ref 0.450–4.500)

## 2017-08-13 LAB — HEPATIC FUNCTION PANEL
ALBUMIN: 4.5 g/dL (ref 3.5–5.5)
ALT: 14 IU/L (ref 0–32)
AST: 22 IU/L (ref 0–40)
Alkaline Phosphatase: 67 IU/L (ref 39–117)
BILIRUBIN, DIRECT: 0.16 mg/dL (ref 0.00–0.40)
Bilirubin Total: 0.6 mg/dL (ref 0.0–1.2)
TOTAL PROTEIN: 6.7 g/dL (ref 6.0–8.5)

## 2017-08-13 LAB — LIPID PANEL
CHOL/HDL RATIO: 2.4 ratio (ref 0.0–4.4)
CHOLESTEROL TOTAL: 200 mg/dL — AB (ref 100–199)
HDL: 85 mg/dL (ref >39)
LDL Calculated: 108 mg/dL — ABNORMAL HIGH (ref 0–99)
TRIGLYCERIDES: 37 mg/dL (ref 0–149)
VLDL CHOLESTEROL CAL: 7 mg/dL (ref 5–40)

## 2017-08-13 NOTE — Telephone Encounter (Signed)
Called patient and left a detailed voice message on phone regarding results. 

## 2017-08-30 ENCOUNTER — Encounter: Payer: Self-pay | Admitting: *Deleted

## 2017-09-03 ENCOUNTER — Encounter: Payer: Self-pay | Admitting: Cardiology

## 2017-09-06 ENCOUNTER — Ambulatory Visit (INDEPENDENT_AMBULATORY_CARE_PROVIDER_SITE_OTHER): Payer: No Typology Code available for payment source

## 2017-09-06 DIAGNOSIS — R002 Palpitations: Secondary | ICD-10-CM

## 2017-09-06 DIAGNOSIS — R079 Chest pain, unspecified: Secondary | ICD-10-CM | POA: Diagnosis not present

## 2017-09-06 LAB — EXERCISE TOLERANCE TEST
CHL RATE OF PERCEIVED EXERTION: 16
CSEPED: 6 min
Estimated workload: 8.2 METS
Exercise duration (sec): 50 s
MPHR: 164 {beats}/min
Peak HR: 157 {beats}/min
Percent HR: 95 %
Rest HR: 71 {beats}/min

## 2017-09-17 ENCOUNTER — Ambulatory Visit: Payer: No Typology Code available for payment source | Admitting: Cardiology

## 2017-12-23 ENCOUNTER — Telehealth: Payer: Self-pay | Admitting: Emergency Medicine

## 2017-12-23 NOTE — Telephone Encounter (Signed)
Please book today

## 2017-12-23 NOTE — Telephone Encounter (Signed)
Pt called and stated he right ear hurts to touch and feels like it is clogged. When would you like me to schedule patient and I will call patient back to schedule appt. Thanks.

## 2017-12-23 NOTE — Telephone Encounter (Signed)
Called patient and left voicemail to call back and make appt for today.

## 2017-12-24 ENCOUNTER — Encounter: Payer: Self-pay | Admitting: Internal Medicine

## 2017-12-24 ENCOUNTER — Ambulatory Visit: Payer: No Typology Code available for payment source | Admitting: Internal Medicine

## 2017-12-24 VITALS — BP 120/80 | HR 80 | Temp 98.1°F | Ht 68.5 in | Wt 136.0 lb

## 2017-12-24 DIAGNOSIS — H6121 Impacted cerumen, right ear: Secondary | ICD-10-CM | POA: Diagnosis not present

## 2017-12-24 MED ORDER — AMOXICILLIN 500 MG PO CAPS: 500.0000 mg | ORAL_CAPSULE | Freq: Three times a day (TID) | ORAL | 0 refills | Status: DC

## 2017-12-24 MED ORDER — NEOMYCIN-POLYMYXIN-HC 3.5-10000-1 OT SUSP: 4.0000 [drp] | Freq: Four times a day (QID) | OTIC | 0 refills | Status: DC

## 2017-12-24 NOTE — Patient Instructions (Signed)
Amoxicillin 500 mg 3 times a day for 10 days.  Cortisporin otic suspension 4 drops 4 times a day for 5 days.

## 2017-12-24 NOTE — Progress Notes (Signed)
   Subjective:    Patient ID: Martha Cabrera, female    DOB: 11-16-1960, 57 y.o.   MRN: [id number]  HPI 57 year old Female with backpacking a couple of weekends ago and used earplugs to sleep at night.  Right ear is stopped up.  Cannot hear well out of it.  No URI symptoms.  No fever.    Review of Systems see above     Objective:   Physical Exam Right TM is obscured by impacted cerumen.  Attempted to remove some of the cerumen with curette today but it was painful for the patient.  How ever I was able to remove enough to where she can now hear     Assessment & Plan:  Impacted cerumen right ear  Plan: She is going to use Cortisporin otic suspension 4 drops in right ear canal 4 times a day and I will look at her ear again at the end of the week.  Amoxicillin 500 mg 3 times a day for 10 days.

## 2017-12-27 ENCOUNTER — Encounter: Payer: Self-pay | Admitting: Internal Medicine

## 2017-12-27 ENCOUNTER — Ambulatory Visit (INDEPENDENT_AMBULATORY_CARE_PROVIDER_SITE_OTHER): Payer: No Typology Code available for payment source | Admitting: Internal Medicine

## 2017-12-27 VITALS — BP 90/70 | HR 69 | Temp 98.0°F | Ht 68.5 in | Wt 136.0 lb

## 2017-12-27 DIAGNOSIS — H60331 Swimmer's ear, right ear: Secondary | ICD-10-CM | POA: Diagnosis not present

## 2017-12-27 DIAGNOSIS — H60501 Unspecified acute noninfective otitis externa, right ear: Secondary | ICD-10-CM | POA: Insufficient documentation

## 2017-12-27 DIAGNOSIS — H6121 Impacted cerumen, right ear: Secondary | ICD-10-CM | POA: Insufficient documentation

## 2017-12-27 NOTE — Patient Instructions (Signed)
Refer to ENT today for evaluation of right ear external cabal.

## 2017-12-27 NOTE — Progress Notes (Signed)
   Subjective:    Patient ID: ANJANI FEUERBORN, female    DOB: Jun 09, 1960, 57 y.o.   MRN: [id number]  HPI Micah Flesher backpacking recently and used ear plugs. Right ear became stopped up. Seen November 5 with impacted cerumen. Attempted to remove.It was painful. Gave Amoxiicillin 500 mg tid for 10 days and Cortisporin otic. Still can't hear out of ear. Here today for follow up.   Review of Systems     Objective:   Physical Exam Debris removed from external canal but distal canal swollen. Refer to ENT. Pt is anxious about this.       Assessment & Plan:  Otitis externa right ear  Plan: Refer to ENT for evaluation

## 2018-03-11 ENCOUNTER — Encounter: Payer: Self-pay | Admitting: Internal Medicine

## 2018-03-11 ENCOUNTER — Ambulatory Visit: Payer: No Typology Code available for payment source | Admitting: Internal Medicine

## 2018-03-11 VITALS — BP 90/60 | HR 82 | Temp 98.6°F | Ht 68.5 in | Wt 136.0 lb

## 2018-03-11 DIAGNOSIS — R519 Headache, unspecified: Secondary | ICD-10-CM

## 2018-03-11 DIAGNOSIS — R5383 Other fatigue: Secondary | ICD-10-CM

## 2018-03-11 DIAGNOSIS — R51 Headache: Secondary | ICD-10-CM | POA: Diagnosis not present

## 2018-03-11 LAB — CBC WITH DIFFERENTIAL/PLATELET
Absolute Monocytes: 497 cells/uL (ref 200–950)
Basophils Absolute: 42 cells/uL (ref 0–200)
Basophils Relative: 0.6 %
EOS PCT: 2.6 %
Eosinophils Absolute: 182 cells/uL (ref 15–500)
HCT: 39.4 % (ref 35.0–45.0)
Hemoglobin: 13.4 g/dL (ref 11.7–15.5)
Lymphs Abs: 2177 cells/uL (ref 850–3900)
MCH: 30.2 pg (ref 27.0–33.0)
MCHC: 34 g/dL (ref 32.0–36.0)
MCV: 88.9 fL (ref 80.0–100.0)
MONOS PCT: 7.1 %
MPV: 10.6 fL (ref 7.5–12.5)
NEUTROS PCT: 58.6 %
Neutro Abs: 4102 cells/uL (ref 1500–7800)
PLATELETS: 260 10*3/uL (ref 140–400)
RBC: 4.43 10*6/uL (ref 3.80–5.10)
RDW: 12.3 % (ref 11.0–15.0)
TOTAL LYMPHOCYTE: 31.1 %
WBC: 7 10*3/uL (ref 3.8–10.8)

## 2018-03-16 NOTE — Patient Instructions (Addendum)
CBC drawn and is within normal limits.  Continue course of Tamiflu previously prescribed.  Rest and drink plenty of fluids and treat headache symptomatically.  Call if not improving.

## 2018-03-16 NOTE — Progress Notes (Signed)
   Subjective:    Patient ID: Martha Cabrera, female    DOB: 13-Apr-1960, 58 y.o.   MRN: [id number]  HPI Patient in today complaining of headache and fatigue.  She thought she might be coming down with the flu.  Has not felt well.  Husband has been diagnosed with flu and she was placed on Tamiflu by his physician and has been taking 1 capsule daily.    Review of Systems history of migraine headaches     Objective:   Physical Exam Skin warm and dry.  Nodes none.  TMs and pharynx are clear.  Neck is supple.  Chest clear.  She looks fatigued.       Assessment & Plan:  Headache- with history of migraine headaches.  Not sure if this is related to an acute illness at this point.  Treat symptomatically.  Possible viral syndrome-has started prophylactic Tamiflu a few days ago.  Cannot really test for flu at this point.  CBC drawn and pending.  Plan: Treat headache symptomatically.  Finish course of Tamiflu prescribed by another physician.  Call if not getting better.

## 2018-03-17 ENCOUNTER — Ambulatory Visit (INDEPENDENT_AMBULATORY_CARE_PROVIDER_SITE_OTHER): Payer: No Typology Code available for payment source | Admitting: Internal Medicine

## 2018-03-17 DIAGNOSIS — Z23 Encounter for immunization: Secondary | ICD-10-CM

## 2018-03-17 NOTE — Patient Instructions (Signed)
Patient received a flu vaccine IM L deltoid, AV, CMA  

## 2018-03-17 NOTE — Progress Notes (Signed)
Flu vaccine given by CMA 

## 2018-04-10 ENCOUNTER — Encounter: Payer: Self-pay | Admitting: Internal Medicine

## 2018-04-10 ENCOUNTER — Ambulatory Visit (INDEPENDENT_AMBULATORY_CARE_PROVIDER_SITE_OTHER): Payer: No Typology Code available for payment source | Admitting: Internal Medicine

## 2018-04-10 VITALS — BP 100/70 | HR 96 | Temp 98.9°F | Ht 68.5 in | Wt 138.0 lb

## 2018-04-10 DIAGNOSIS — R52 Pain, unspecified: Secondary | ICD-10-CM | POA: Diagnosis not present

## 2018-04-10 DIAGNOSIS — R110 Nausea: Secondary | ICD-10-CM

## 2018-04-10 DIAGNOSIS — B349 Viral infection, unspecified: Secondary | ICD-10-CM | POA: Diagnosis not present

## 2018-04-10 DIAGNOSIS — R509 Fever, unspecified: Secondary | ICD-10-CM | POA: Diagnosis not present

## 2018-04-10 LAB — POCT INFLUENZA A/B
INFLUENZA A, POC: NEGATIVE
Influenza B, POC: NEGATIVE

## 2018-04-10 MED ORDER — ONDANSETRON HCL 4 MG PO TABS: 4.0000 mg | ORAL_TABLET | Freq: Three times a day (TID) | ORAL | 0 refills | Status: DC | PRN

## 2018-04-10 NOTE — Progress Notes (Signed)
   Subjective:    Patient ID: Martha Cabrera, female    DOB: 12-10-1960, 58 y.o.   MRN: [id number]  HPI 58 year old Female has had nausea vomiting and diarrhea for the past 24 hours.  Has malaise and fatigue.  Says she has had low-grade fever.  No bloody diarrhea.  Has not had any recent travel history.  No sore throat.  Has had a nagging headache that is aggravating and not relieved with over-the-counter medications.  She has a history of migraine headaches.  She is concerned about possible flu.    Review of Systems see above says diarrhea has slowed down some over the past few hours.  Feels less nauseated.     Objective:   Physical Exam Skin pale warm and dry.  Pharynx is clear.  TMs are clear.  Neck is supple.  Chest clear to auscultation.  Rapid flu test is negative.       Assessment & Plan:  Acute viral syndrome  Plan: Zofran 4 mg tablets every 8 hours as needed nausea.  Clear liquids until diarrhea and vomiting have resolved and advance diet slowly.  Tylenol or Advil if needed for headache/fever.  Rest and drink plenty of fluids.  Rapid flu test is negative.

## 2018-04-10 NOTE — Patient Instructions (Signed)
Your rapid flu test is negative.  It appears you have a viral syndrome.  Take Zofran 4 mg tablets if needed for nausea.  Stay with clear liquids until nausea and diarrhea have resolved for 24 hours then advance diet slowly.  Tylenol or Advil if needed for fever or headache.  Rest and drink plenty of fluids.

## 2018-07-04 DIAGNOSIS — S92403A Displaced unspecified fracture of unspecified great toe, initial encounter for closed fracture: Secondary | ICD-10-CM | POA: Insufficient documentation

## 2018-09-03 DIAGNOSIS — S161XXA Strain of muscle, fascia and tendon at neck level, initial encounter: Secondary | ICD-10-CM | POA: Insufficient documentation

## 2018-09-03 DIAGNOSIS — M542 Cervicalgia: Secondary | ICD-10-CM | POA: Insufficient documentation

## 2018-10-16 ENCOUNTER — Other Ambulatory Visit: Payer: Self-pay

## 2018-10-16 ENCOUNTER — Encounter: Payer: Self-pay | Admitting: Internal Medicine

## 2018-10-16 ENCOUNTER — Ambulatory Visit (INDEPENDENT_AMBULATORY_CARE_PROVIDER_SITE_OTHER): Payer: No Typology Code available for payment source | Admitting: Internal Medicine

## 2018-10-16 VITALS — BP 102/60 | HR 66 | Temp 98.3°F | Ht 68.5 in | Wt 140.0 lb

## 2018-10-16 DIAGNOSIS — R8290 Unspecified abnormal findings in urine: Secondary | ICD-10-CM

## 2018-10-16 DIAGNOSIS — R300 Dysuria: Secondary | ICD-10-CM | POA: Diagnosis not present

## 2018-10-16 DIAGNOSIS — R350 Frequency of micturition: Secondary | ICD-10-CM

## 2018-10-16 LAB — POCT URINALYSIS DIPSTICK
Appearance: NEGATIVE
Bilirubin, UA: NEGATIVE
Blood, UA: NEGATIVE
Glucose, UA: NEGATIVE
Ketones, UA: NEGATIVE
Leukocytes, UA: NEGATIVE
Nitrite, UA: NEGATIVE
Odor: NEGATIVE
Protein, UA: NEGATIVE
Spec Grav, UA: 1.01 (ref 1.010–1.025)
Urobilinogen, UA: 0.2 U/dL
pH, UA: 6.5 (ref 5.0–8.0)

## 2018-10-16 MED ORDER — CIPROFLOXACIN HCL 500 MG PO TABS: 500.0000 mg | ORAL_TABLET | Freq: Two times a day (BID) | ORAL | 0 refills | Status: DC

## 2018-10-16 NOTE — Progress Notes (Signed)
   Subjective:    Patient ID: Martha Cabrera, female    DOB: 03-07-1960, 58 y.o.   MRN: [id number]  HPI Onset yesterday of frequency and suprapubic pressure. No fever, chills, or hematuria. Going out of town for the weekend.Has had similar symptoms in the past. Had E.coli UTI May 2019.    Review of Systems see above     Objective:   Physical Exam No CVA tenderness. Urine dipstick is normal       Assessment & Plan:  Urethritis  Plan: Pt to take Cipro 500 mg bid x 3 days pending culture results. Total Rx is for 10 days. Save remaining 7 days of Cipro for travel.

## 2018-10-16 NOTE — Patient Instructions (Signed)
Cipro 500 mg twice daily x 3 days and save remaining 7 days for travel in the future. Culture pending.

## 2018-10-17 LAB — URINE CULTURE
MICRO NUMBER:: 818759
SPECIMEN QUALITY:: ADEQUATE

## 2018-12-08 ENCOUNTER — Other Ambulatory Visit: Payer: Self-pay

## 2018-12-08 MED ORDER — SUMATRIPTAN SUCCINATE 100 MG PO TABS: 100.0000 mg | ORAL_TABLET | ORAL | 1 refills | Status: AC | PRN

## 2018-12-17 ENCOUNTER — Telehealth: Payer: Self-pay | Admitting: Internal Medicine

## 2018-12-17 ENCOUNTER — Other Ambulatory Visit: Payer: Self-pay

## 2018-12-17 ENCOUNTER — Encounter: Payer: Self-pay | Admitting: Internal Medicine

## 2018-12-17 ENCOUNTER — Ambulatory Visit (INDEPENDENT_AMBULATORY_CARE_PROVIDER_SITE_OTHER): Payer: No Typology Code available for payment source | Admitting: Internal Medicine

## 2018-12-17 VITALS — BP 106/79 | Temp 99.9°F | Ht 68.5 in | Wt 140.0 lb

## 2018-12-17 DIAGNOSIS — Z20828 Contact with and (suspected) exposure to other viral communicable diseases: Secondary | ICD-10-CM | POA: Diagnosis not present

## 2018-12-17 DIAGNOSIS — R0981 Nasal congestion: Secondary | ICD-10-CM | POA: Diagnosis not present

## 2018-12-17 DIAGNOSIS — R509 Fever, unspecified: Secondary | ICD-10-CM | POA: Diagnosis not present

## 2018-12-17 DIAGNOSIS — K624 Stenosis of anus and rectum: Secondary | ICD-10-CM

## 2018-12-17 NOTE — Telephone Encounter (Signed)
After speaking with Dr Renold Genta called patient and set up virtual visit

## 2018-12-17 NOTE — Patient Instructions (Signed)
To have COVID-19 testing [employer].

## 2018-12-17 NOTE — Telephone Encounter (Signed)
Martha Cabrera [phone]  Ren called to say that she returned last night from traveling out [place], and she had a fever of 99.9, sinus,just does not feel right. They were in different hotels, restaurants. Would like to be tested for COVID.

## 2018-12-17 NOTE — Progress Notes (Signed)
   Subjective:    Patient ID: Martha Cabrera, female    DOB: October 28, 1960, 58 y.o.   MRN: [id number]  HPI 58 year old Female seen today via interactive audio and video telecommunications due to the Coronavirus pandemic.  She is agreeable to visit in this format today.  She is identified using 2 identifiers as Martha Cabrera, a patient in this practice.  Patient  returned last night from a trip to [place].  She and her husband drove out there to go pheasant hunting.  Its noted while they were there.  Last evening she began to develop some nasal congestion.  She took her temperature and says it was 99.9 degrees.  She has no dysgeusia.  No cough.  No sore throat.  No headache.  Has some malaise and fatigue.  She is concerned because her mother is having 1/90 birthday celebration in about 2 weeks.  Patient would like to be tested for COVID-19  She denies any nasal drainage that is discolored.    Review of Systems see above     Objective:   Physical Exam  She sounds a bit nasally congested when she speaks      Assessment & Plan:  Acute upper respiratory infection  Out-of-state travel  Plan: COVID-19 test at [facility].

## 2018-12-18 LAB — NOVEL CORONAVIRUS, NAA: SARS-CoV-2, NAA: DETECTED — AB

## 2018-12-19 NOTE — Telephone Encounter (Signed)
Faxed Positive COVID result and demographics to [facility].

## 2018-12-25 ENCOUNTER — Other Ambulatory Visit: Payer: Self-pay | Admitting: Obstetrics

## 2018-12-25 DIAGNOSIS — Z1231 Encounter for screening mammogram for malignant neoplasm of breast: Secondary | ICD-10-CM

## 2018-12-31 ENCOUNTER — Other Ambulatory Visit: Payer: Self-pay

## 2019-01-09 ENCOUNTER — Other Ambulatory Visit: Payer: Self-pay

## 2019-01-09 DIAGNOSIS — Z20822 Contact with and (suspected) exposure to covid-19: Secondary | ICD-10-CM

## 2019-01-12 LAB — NOVEL CORONAVIRUS, NAA: SARS-CoV-2, NAA: NOT DETECTED

## 2019-02-25 ENCOUNTER — Other Ambulatory Visit: Payer: Self-pay

## 2019-02-25 ENCOUNTER — Ambulatory Visit
Admission: RE | Admit: 2019-02-25 | Discharge: 2019-02-25 | Disposition: A | Discharge status: Home / Self Care | Payer: No Typology Code available for payment source | Source: Ambulatory Visit | Attending: Obstetrics | Admitting: Obstetrics

## 2019-02-25 DIAGNOSIS — Z1231 Encounter for screening mammogram for malignant neoplasm of breast: Secondary | ICD-10-CM

## 2019-03-06 ENCOUNTER — Ambulatory Visit (INDEPENDENT_AMBULATORY_CARE_PROVIDER_SITE_OTHER): Payer: No Typology Code available for payment source | Admitting: Internal Medicine

## 2019-03-06 ENCOUNTER — Encounter: Payer: Self-pay | Admitting: Internal Medicine

## 2019-03-06 ENCOUNTER — Other Ambulatory Visit: Payer: Self-pay

## 2019-03-06 VITALS — BP 110/80 | HR 64 | Temp 98.0°F | Ht 68.5 in | Wt 140.0 lb

## 2019-03-06 DIAGNOSIS — R300 Dysuria: Secondary | ICD-10-CM | POA: Diagnosis not present

## 2019-03-06 DIAGNOSIS — R8290 Unspecified abnormal findings in urine: Secondary | ICD-10-CM | POA: Diagnosis not present

## 2019-03-06 DIAGNOSIS — Z23 Encounter for immunization: Secondary | ICD-10-CM

## 2019-03-06 LAB — POCT URINALYSIS DIPSTICK
Bilirubin, UA: NEGATIVE
Blood, UA: NEGATIVE
Glucose, UA: NEGATIVE
Ketones, UA: NEGATIVE
Nitrite, UA: NEGATIVE
Protein, UA: NEGATIVE
Spec Grav, UA: 1.01 (ref 1.010–1.025)
Urobilinogen, UA: 0.2 E.U./dL
pH, UA: 6.5 (ref 5.0–8.0)

## 2019-03-06 MED ORDER — CIPROFLOXACIN HCL 500 MG PO TABS: 500.0000 mg | ORAL_TABLET | Freq: Two times a day (BID) | ORAL | 0 refills | Status: DC

## 2019-03-06 NOTE — Progress Notes (Signed)
   Subjective:    Patient ID: Martha Cabrera, female    DOB: [birth year], 59 y.o.   MRN: [id number]  HPI Onset this am burning at end of stream and after urinating. No fever or chills. No nausea or vomiting.Hx of occasional UTIs.  Patient was diagnosed with Covid 19 October 28 after trip to [place]. Has recovered and was not very ill. Husband has had prolonged recovery with persistent symptoms.    Review of Systems no fever or chills     Objective:   Physical Exam  BP 110/80. No CVA tenderness. No acte distress.  She is afebrile.  Dipstick urine specimen shows small LE. Culture sent.        Assessment & Plan:  Dysuria  Need for flu vaccine-this was given today  Plan: Cipro 500 mg twice daily for 10 days.  Culture pending.  Addendum March 08, 2019: Culture shows mixed flora.  Patient needs to finish course of antibiotics.  She was symptomatic.

## 2019-03-07 LAB — URINE CULTURE
MICRO NUMBER:: 10046971
SPECIMEN QUALITY:: ADEQUATE

## 2019-03-08 NOTE — Patient Instructions (Addendum)
Cipro 500 mg twice daily for 10 days.  Culture sent.  Flu vaccine given.

## 2019-04-28 ENCOUNTER — Telehealth: Payer: Self-pay | Admitting: Internal Medicine

## 2019-04-28 NOTE — Telephone Encounter (Signed)
LVM to call office to schedule appointment

## 2019-04-28 NOTE — Telephone Encounter (Signed)
Martha Cabrera [phone]  Armando Reichert called and schedule CPE with labs, but she would like to come in sooner to discuss anxiety and depression.

## 2019-04-28 NOTE — Telephone Encounter (Signed)
Tomorrow :)

## 2019-04-29 NOTE — Telephone Encounter (Signed)
Patient called back and scheduled appointment

## 2019-04-30 ENCOUNTER — Encounter: Payer: Self-pay | Admitting: Internal Medicine

## 2019-04-30 ENCOUNTER — Ambulatory Visit (INDEPENDENT_AMBULATORY_CARE_PROVIDER_SITE_OTHER): Payer: No Typology Code available for payment source | Admitting: Internal Medicine

## 2019-04-30 ENCOUNTER — Other Ambulatory Visit: Payer: Self-pay

## 2019-04-30 VITALS — BP 130/70 | HR 68 | Temp 98.0°F | Ht 68.0 in | Wt 138.0 lb

## 2019-04-30 DIAGNOSIS — R5383 Other fatigue: Secondary | ICD-10-CM

## 2019-04-30 DIAGNOSIS — F329 Major depressive disorder, single episode, unspecified: Secondary | ICD-10-CM

## 2019-04-30 DIAGNOSIS — Z1329 Encounter for screening for other suspected endocrine disorder: Secondary | ICD-10-CM | POA: Diagnosis not present

## 2019-04-30 DIAGNOSIS — F419 Anxiety disorder, unspecified: Secondary | ICD-10-CM | POA: Diagnosis not present

## 2019-04-30 DIAGNOSIS — F32A Depression, unspecified: Secondary | ICD-10-CM

## 2019-04-30 MED ORDER — BUPROPION HCL ER (XL) 150 MG PO TB24: 150.0000 mg | ORAL_TABLET | Freq: Every day | ORAL | 0 refills | Status: DC

## 2019-04-30 NOTE — Progress Notes (Signed)
   Subjective:    Patient ID: Martha Cabrera, female    DOB: 1960-06-14, 59 y.o.   MRN: [id number]  HPI 59 year old Female here to discuss situational stress at home which has been present for a number of years. Patient has a Social worker but now feels she needs antidepressant therapy. Feels fatigued and exasperated with current situation.   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 She is married and has 2 sons. Oldest son has hx ADD, is attending [employer] and doing well. She spent lots of time when he was younger trying to get help and services for him so he could be successful in school. Her younger son has done well in school (and will be attending [employer] near [place]. in the Fall) has stated to her that he felt neglected by her and has turned to a paternal aunt for support. He stayed with paternal aunt for several months in [place] during the pandemic. She and her husband are struggling with these issues right now. She feels he has not been supportive of her.  Patient and husband had Covid 20 in October. Likely contracted it on a hunting trip/vacation to [place] where they stayed in a hunting lodge. They recovered at home and have been well.    Review of Systems fatigue, not sleeping well, exasperated.  She has a history of migraine headaches     Objective:   Physical Exam  VS reviewed.  Skin warm and dry.  Nodes none.  No thyromegaly.  Chest clear to auscultation.  Cardiac exam regular rate and rhythm normal S1 and S2.  Extremities without edema or edema.  Alert and oriented x3.  Seems fatigued.      Assessment & Plan:  Situational stress  Fatigue  Plan: Patient had CBC with differential and TSH as well as C-Met drawn today.  I am starting her on Wellbutrin XL 150 mg daily complaint seems agitated.  I have asked her to consider psychiatric consultation for med management and counseling going forward.  This is an ongoing difficult situation she likely  needs professional psychiatric counseling for this as well as medication management.  She agrees to consider this.  She is a [place] and she prefers someone with a spiritual approach to counseling.

## 2019-05-01 LAB — CBC WITH DIFFERENTIAL/PLATELET
Absolute Monocytes: 607 cells/uL (ref 200–950)
Basophils Absolute: 59 cells/uL (ref 0–200)
Basophils Relative: 0.8 %
Eosinophils Absolute: 178 cells/uL (ref 15–500)
Eosinophils Relative: 2.4 %
HCT: 42.7 % (ref 35.0–45.0)
Hemoglobin: 14.4 g/dL (ref 11.7–15.5)
Lymphs Abs: 2124 cells/uL (ref 850–3900)
MCH: 30 pg (ref 27.0–33.0)
MCHC: 33.7 g/dL (ref 32.0–36.0)
MCV: 89 fL (ref 80.0–100.0)
MPV: 11.1 fL (ref 7.5–12.5)
Monocytes Relative: 8.2 %
Neutro Abs: 4433 cells/uL (ref 1500–7800)
Neutrophils Relative %: 59.9 %
Platelets: 258 10*3/uL (ref 140–400)
RBC: 4.8 10*6/uL (ref 3.80–5.10)
RDW: 12.7 % (ref 11.0–15.0)
Total Lymphocyte: 28.7 %
WBC: 7.4 10*3/uL (ref 3.8–10.8)

## 2019-05-01 LAB — COMPLETE METABOLIC PANEL WITH GFR
AG Ratio: 1.7 (calc) (ref 1.0–2.5)
ALT: 14 U/L (ref 6–29)
AST: 17 U/L (ref 10–35)
Albumin: 4.5 g/dL (ref 3.6–5.1)
Alkaline phosphatase (APISO): 78 U/L (ref 37–153)
BUN: 16 mg/dL (ref 7–25)
CO2: 29 mmol/L (ref 20–32)
Calcium: 10 mg/dL (ref 8.6–10.4)
Chloride: 102 mmol/L (ref 98–110)
Creat: 0.78 mg/dL (ref 0.50–1.05)
GFR, Est African American: 98 mL/min/{1.73_m2} (ref >=60)
GFR, Est Non African American: 84 mL/min/{1.73_m2} (ref >=60)
Globulin: 2.7 g/dL (calc) (ref 1.9–3.7)
Glucose, Bld: 101 mg/dL — ABNORMAL HIGH (ref 65–99)
Potassium: 5.4 mmol/L — ABNORMAL HIGH (ref 3.5–5.3)
Sodium: 141 mmol/L (ref 135–146)
Total Bilirubin: 0.2 mg/dL (ref 0.2–1.2)
Total Protein: 7.2 g/dL (ref 6.1–8.1)

## 2019-05-01 LAB — TSH: TSH: 1.3 mIU/L (ref 0.40–4.50)

## 2019-05-01 NOTE — Patient Instructions (Addendum)
Begin Wellbutrin XL 150 mg daily.  Referral to [facility] for med consult/ counseling. Labs are WNL.

## 2019-05-08 ENCOUNTER — Telehealth: Payer: Self-pay | Admitting: Internal Medicine

## 2019-05-08 NOTE — Telephone Encounter (Signed)
Called and scheduled appointment.

## 2019-05-08 NOTE — Telephone Encounter (Signed)
Martha Cabrera [phone]  Kenyon called to say since she started Wellbutrin she has been having constipation, for the last 4 days she has been taking a stool softener and it has helped, she would like to know if she should continue taking stool softener or is there something else she should be doing and will this eventually get better and not cause constipation.  She did also say she had gotten an appointment with Dr Jennelle Human.

## 2019-05-08 NOTE — Telephone Encounter (Signed)
Take a laxative like Dulcolax to get cleaned out then start Miralax  daily

## 2019-05-14 ENCOUNTER — Other Ambulatory Visit: Payer: Self-pay

## 2019-05-14 ENCOUNTER — Ambulatory Visit (INDEPENDENT_AMBULATORY_CARE_PROVIDER_SITE_OTHER): Payer: No Typology Code available for payment source | Admitting: Internal Medicine

## 2019-05-14 ENCOUNTER — Encounter: Payer: Self-pay | Admitting: Internal Medicine

## 2019-05-14 VITALS — BP 110/70 | HR 68 | Temp 98.7°F | Ht 68.0 in | Wt 137.0 lb

## 2019-05-14 DIAGNOSIS — F419 Anxiety disorder, unspecified: Secondary | ICD-10-CM

## 2019-05-14 DIAGNOSIS — F329 Major depressive disorder, single episode, unspecified: Secondary | ICD-10-CM

## 2019-05-14 DIAGNOSIS — F32A Depression, unspecified: Secondary | ICD-10-CM

## 2019-05-14 MED ORDER — SERTRALINE HCL 50 MG PO TABS: ORAL_TABLET | ORAL | 0 refills | Status: DC

## 2019-05-14 NOTE — Progress Notes (Deleted)
z

## 2019-05-16 NOTE — Patient Instructions (Signed)
Discontinue Wellbutrin.  Zoloft 50 mg start with 1/2 tablet daily for 5 days then increase to 1 tablet daily.  Keep medication consult appointment with Dr. Jennelle Human.  Continue counseling.  Physical exam and fasting labs due here April 30

## 2019-05-16 NOTE — Progress Notes (Signed)
   Subjective:    Patient ID: Martha Cabrera, female    DOB: 09-Oct-1960, 59 y.o.   MRN: [id number]  HPI Patient presented here March 11 for evaluation of anxiety depression and situational stress which is longstanding.  She was started on Wellbutrin but had significant issues with constipation that were not resolved with laxative and MiraLAX.  She stopped taking it.  She has appointment here for annual physical exam April 30.  She wants to try another antidepressant.  We discussed various options of SSRI medications.  I thought Wellbutrin was a good choice initially because of fatigue and lack of energy.  She continues with her counselor who she has been seeing for a number of years and likes her very much.  She has appointment to see Dr. Jennelle Human in the near future.  I would like for him to provide input for her medication regimen.  However she would like to pursue antidepressant medication at this point in time so we will try Zoloft 50 mg daily.  She will take half tablet daily for 5 days then 1 full tablet.    Review of Systems see above constipation with Wellbutrin     Objective:   Physical Exam  Not examined today but spent 20 minutes speaking with her about continued situational stress surrounding her 2 sons and family situation.      Assessment & Plan:  Family stress  Anxiety and depression  Plan: Was not able to tolerate Wellbutrin.  Try Zoloft 50 mg daily starting with 1/2 tablet for 5 days then increasing to 1 whole tablet (50 mg) daily.  Follow-up here April 30 for health maintenance exam.  Medication consultation with Dr. Jennelle Human and continue with counseling.

## 2019-06-08 ENCOUNTER — Other Ambulatory Visit: Payer: Self-pay

## 2019-06-08 ENCOUNTER — Ambulatory Visit (INDEPENDENT_AMBULATORY_CARE_PROVIDER_SITE_OTHER): Payer: No Typology Code available for payment source | Admitting: Psychiatry

## 2019-06-08 ENCOUNTER — Encounter: Payer: Self-pay | Admitting: Psychiatry

## 2019-06-08 DIAGNOSIS — F4323 Adjustment disorder with mixed anxiety and depressed mood: Secondary | ICD-10-CM

## 2019-06-08 DIAGNOSIS — T50905A Adverse effect of unspecified drugs, medicaments and biological substances, initial encounter: Secondary | ICD-10-CM

## 2019-06-08 NOTE — Progress Notes (Signed)
[facility] Initial Note  06/08/2019 1:11 PM Martha Cabrera  MRN:  [id number]  Chief Complaint:  Chief Complaint    Depression; Anxiety; Medication Problem     depression and anxiety   HPI: Didn't tolerate Wellbutrin with constipation and insomnia. Started sertraline and up to 50 for 16 days.  Never felt this horrible before the meds.  Therapist on and off for couple of weeks.  Thinks Zoloft made her negative thoughts worse.  Constant negative loop of thoughts.  Not before the meds.  It's worse.  Also weakness and fatigue.  One afternoon last week felt like herself.  Otherwise hopeless.  SE and constant yawning and super weak.  Insomnia with EMA bc of the negative thoughts.  Before could use mindfulness and prayer with relief but not now.  Normally good appetite but meds have suppressed it.  Reduced enjoyment and probably weight loss. 2nd son sent her a letter last year that was very critical of her behavior and handling of problematic brother.  Before the meds was really upset with situations and crying a lot.  Seemed no solutions to situations in her family.  Not the negativity like this ever before.  Was not persistently down but more frustrated.  Stuff feelings bc not confrontational and needed therapy.  Energy was OK before meds and exercised.  Interest was perhaps lagging some before the meds.  Was able to enjoy things before the meds.  H before the meds thought she had issues with his family and her son was out of proportion to the situation.  Good function before meds. No sig anxity sx prior to meds except upset over what's going on in the family.  Denies manic history.  Visit Diagnosis:    ICD-10-CM   1. Adjustment disorder with mixed anxiety and depressed mood  F43.23   2. Medication side effect, initial encounter  T50.905A     Past Psychiatric History: No psychiatrists.  Therapist Lovett Calender regularly 6 mos. Past Psychiatric Medication Trials: None until March  Wellbutrin for 1 week SE,  sertraline 50 started 05/21/19   Past Medical History: History reviewed. No pertinent past medical history.  Past Surgical History:  Procedure Laterality Date  . AUGMENTATION MAMMAPLASTY  1989   RETRO PECTORAL SALINE  . BLADDER SUSPENSION    . BREAST ENHANCEMENT SURGERY     1990  . BREAST SURGERY  1990   Breast Augmentation    Family Psychiatric History: Mo depression and anxiety Tx late 79s with ECT repeatedly helped and didn't have FU therapy for awhile.  Now Wellbutrin and lithium and overall doing OK. Mat GM psych hospitalization. Sister no psych meds.  Brother untreated MH problems Oldest Son problems  Family History:  Family History  Problem Relation Age of Onset  . Depression Mother   . Diabetes Father   . Hyperlipidemia Father   . Hypertension Father   . Crohn's disease Father   . Hypertension Brother   . ADD / ADHD Son   . Depression Son   . Anxiety disorder Son   . Colon cancer Neg Hx   . Esophageal cancer Neg Hx   . Stomach cancer Neg Hx     Social History:   History of work with [employer]. Very little alcohol.  No history of DA abuse Social History   Socioeconomic History  . Marital status: Married    Spouse name: Not on file  . Number of children: Not on file  . Years of education:  Not on file  . Highest education level: Not on file  Occupational History  . Not on file  Tobacco Use  . Smoking status: Former Smoker    Years: 2.00    Types: Cigarettes    Quit date: 10/09/1977    Years since quitting: 41.6  . Smokeless tobacco: Never Used  Substance and Sexual Activity  . Alcohol use: Yes    Alcohol/week: 1.0 - 2.0 standard drinks    Types: 1 - 2 Glasses of wine per week    Comment: 1-2 glasses a wk or less  . Drug use: No  . Sexual activity: Yes    Birth control/protection: I.U.D.  Other Topics Concern  . Not on file  Social History Narrative  . Not on file   Social Determinants of Health   Financial  Resource Strain:   . Difficulty of Paying Living Expenses:   Food Insecurity:   . Worried About Programme researcher, broadcasting/film/video in the Last Year:   . Barista in the Last Year:   Transportation Needs:   . Freight forwarder (Medical):   Marland Kitchen Lack of Transportation (Non-Medical):   Physical Activity:   . Days of Exercise per Week:   . Minutes of Exercise per Session:   Stress:   . Feeling of Stress :   Social Connections:   . Frequency of Communication with Friends and Family:   . Frequency of Social Gatherings with Friends and Family:   . Attends Religious Services:   . Active Member of Clubs or Organizations:   . Attends Banker Meetings:   Marland Kitchen Marital Status:     Allergies: No Known Allergies  Metabolic Disorder Labs: No results found for: HGBA1C, MPG No results found for: PROLACTIN Lab Results  Component Value Date   CHOL 200 (H) 08/12/2017   TRIG 37 08/12/2017   HDL 85 08/12/2017   CHOLHDL 2.4 08/12/2017   LDLCALC 108 (H) 08/12/2017   Lab Results  Component Value Date   TSH 1.30 04/30/2019   TSH 1.730 08/12/2017    Therapeutic Level Labs: No results found for: LITHIUM No results found for: VALPROATE No components found for:  CBMZ  Current Medications: Current Outpatient Medications  Medication Sig Dispense Refill  . sertraline (ZOLOFT) 50 MG tablet TAKE 1/2 A TABLET BY MOUTH FOR 5 DAYS THEN 1 TABLET BY MOUTH DAILY 30 tablet 0  . SUMAtriptan (IMITREX) 100 MG tablet Take 1 tablet (100 mg total) by mouth as needed. 10 tablet 1   No current facility-administered medications for this visit.    Medication Side Effects: Multiple as noted  Orders placed this visit:  No orders of the defined types were placed in this encounter.   Psychiatric Specialty Exam:  Review of Systems  Constitutional: Positive for fatigue. Negative for appetite change, diaphoresis and unexpected weight change.  HENT: Negative for congestion, facial swelling, hearing loss,  sinus pain and tinnitus.   Eyes: Negative for visual disturbance.  Respiratory: Negative for chest tightness and shortness of breath.   Cardiovascular: Negative for chest pain and palpitations.  Gastrointestinal: Negative for abdominal distention, constipation, diarrhea, nausea and vomiting.  Endocrine: Negative for polyuria.  Genitourinary: Negative for difficulty urinating, frequency and pelvic pain.  Musculoskeletal: Negative for arthralgias, back pain, joint swelling and neck pain.  Skin: Negative for rash.  Neurological: Negative for dizziness, tremors, seizures, weakness, numbness and headaches.  Psychiatric/Behavioral: Positive for dysphoric mood and sleep disturbance. Negative for agitation, behavioral problems, confusion,  hallucinations, self-injury and suicidal ideas. The patient is not hyperactive.     There were no vitals taken for this visit.There is no height or weight on file to calculate BMI.  General Appearance: Meticulous and Neat  Eye Contact:  Good  Speech:  Clear and Coherent, Normal Rate and Talkative  Volume:  Normal  Mood:  Dysphoric  Affect:  Appropriate, Congruent and Full Range  Thought Process:  Coherent and Goal Directed no loosening of association  Orientation:  Full (Time, Place, and Person)  Thought Content: Logical   Suicidal Thoughts:  No  Homicidal Thoughts:  No  Memory:  WNL  Judgement:  Good  Insight:  Good  Psychomotor Activity:  Normal  Concentration:  Concentration: Good  Recall:  Good  Fund of Knowledge: Good  Language: Good  Assets:  Communication Skills Desire for Improvement Financial Resources/Insurance Housing Leisure Time Physical Health Resilience Social Support Talents/Skills Transportation Vocational/Educational  ADL's:  Intact  Cognition: WNL  Prognosis:  Good   Screenings:  MDQ negative PHQ2-9     Office Visit from 05/14/2019 in Sharlet Salina, MD Office Visit from 04/30/2019 in Sharlet Salina, MD Office Visit from  03/06/2019 in Sharlet Salina, MD Office Visit from 07/02/2017 in Sharlet Salina, MD  PHQ-2 Total Score  3  2  0  0  PHQ-9 Total Score  10  8  --  --      Receiving Psychotherapy: Yes   Treatment Plan/Recommendations: The patient has been distressed by family dynamics.  There is some level of estrangement between family members that she finds very disturbing.  Several family members are receiving individual counseling including her self.  It had been suggested that perhaps she was dwelling on the family problems in excess and that perhaps antidepressants could help.  However she denied significant other depressive symptoms prior to starting the medications.  She was functioning well.  She was able to enjoy things prior to the medications but was distracted by the family difficulties.  Subsequent to starting antidepressants she feels much more negative and her thoughts and much more depressed with less motivation, less enjoyment, early morning awakening with rumination.  She also has some yawning and excessive tiredness from the sertraline.  The symptoms are all much worse than before the medications. It seems reasonable to discontinue the medication.  Her side effects should resolve fairly quickly.  We discussed the risk of serotonin withdrawal although it does not appear she has been on the sertraline 50 mg long enough that she is at significant risk of withdrawal symptoms.  This was discussed with her and if she had any significant withdrawal symptoms to resume 25 mg sertraline for about 7 days and then discontinue again.  Call if symptoms are bothersome or worse.  She will call if her depressive symptoms are worse.  Recommend family therapy to deal with broken relationships.  Discussed possible therapist for family therapy including TM white and AAron Stewart.  Recommend she discuss this with her current therapist for other alternatives or options.  Reevaluate sx after off the meds.  If she does  have clinically significant depression at that time that we will consider alternatives to SSRIs and Wellbutrin.  Next visit consider release of information to talk with her therapist or at least to send a copy of note to therapist.  FU 6 weeks   Meredith Staggers, MD, DFAPA    Lauraine Rinne, MD

## 2019-06-08 NOTE — Patient Instructions (Signed)
Tina Griffiths family therapist  Karmen Bongo therapist

## 2019-06-11 ENCOUNTER — Other Ambulatory Visit: Payer: Self-pay

## 2019-06-11 ENCOUNTER — Other Ambulatory Visit: Payer: No Typology Code available for payment source | Admitting: Internal Medicine

## 2019-06-11 DIAGNOSIS — E785 Hyperlipidemia, unspecified: Secondary | ICD-10-CM

## 2019-06-11 DIAGNOSIS — E7800 Pure hypercholesterolemia, unspecified: Secondary | ICD-10-CM

## 2019-06-11 LAB — LIPID PANEL
Cholesterol: 210 mg/dL — ABNORMAL HIGH (ref <200)
HDL: 65 mg/dL (ref >=50)
LDL Cholesterol (Calc): 129 mg/dL (calc) — ABNORMAL HIGH
Non-HDL Cholesterol (Calc): 145 mg/dL (calc) — ABNORMAL HIGH (ref <130)
Total CHOL/HDL Ratio: 3.2 (calc) (ref <5.0)
Triglycerides: 64 mg/dL (ref <150)

## 2019-06-19 ENCOUNTER — Other Ambulatory Visit: Payer: Self-pay

## 2019-06-19 ENCOUNTER — Encounter: Payer: Self-pay | Admitting: Internal Medicine

## 2019-06-19 ENCOUNTER — Ambulatory Visit (INDEPENDENT_AMBULATORY_CARE_PROVIDER_SITE_OTHER): Payer: No Typology Code available for payment source | Admitting: Internal Medicine

## 2019-06-19 VITALS — BP 120/78 | HR 78 | Temp 98.3°F | Ht 68.0 in | Wt 138.0 lb

## 2019-06-19 DIAGNOSIS — F419 Anxiety disorder, unspecified: Secondary | ICD-10-CM | POA: Diagnosis not present

## 2019-06-19 DIAGNOSIS — Z0000 Encounter for general adult medical examination without abnormal findings: Secondary | ICD-10-CM

## 2019-06-19 DIAGNOSIS — M792 Neuralgia and neuritis, unspecified: Secondary | ICD-10-CM

## 2019-06-19 DIAGNOSIS — F32A Depression, unspecified: Secondary | ICD-10-CM

## 2019-06-19 DIAGNOSIS — E7800 Pure hypercholesterolemia, unspecified: Secondary | ICD-10-CM | POA: Diagnosis not present

## 2019-06-19 DIAGNOSIS — Z8669 Personal history of other diseases of the nervous system and sense organs: Secondary | ICD-10-CM

## 2019-06-19 DIAGNOSIS — F329 Major depressive disorder, single episode, unspecified: Secondary | ICD-10-CM

## 2019-06-19 DIAGNOSIS — F439 Reaction to severe stress, unspecified: Secondary | ICD-10-CM

## 2019-06-19 LAB — POCT URINALYSIS DIPSTICK
Appearance: NEGATIVE
Bilirubin, UA: NEGATIVE
Blood, UA: NEGATIVE
Glucose, UA: NEGATIVE
Ketones, UA: NEGATIVE
Leukocytes, UA: NEGATIVE
Nitrite, UA: NEGATIVE
Odor: NEGATIVE
Protein, UA: NEGATIVE
Spec Grav, UA: 1.01 (ref 1.010–1.025)
Urobilinogen, UA: 0.2 E.U./dL
pH, UA: 6.5 (ref 5.0–8.0)

## 2019-06-19 NOTE — Progress Notes (Signed)
   Subjective:    Patient ID: Martha Cabrera, female    DOB: 1961/01/21, 59 y.o.   MRN: [id number]  HPI 59 year old Female seen today for health maintenance exam and evaluation of medical issues.  She has not been able to tolerate Zoloft.  Said she felt horrible on it and did not tolerate Wellbutrin.  She spoke with Dr. Jennelle Human and he suggested she not take antidepressant medication at this time.  She says he felt issues were situational and perhaps family therapy would be of help.  Continued situational stress discussed at length today.  Has been upset with husband recently.  She has been having some issues with sharp pains occasionally originating from posterior right parietal area across to the right frontal area lasting only seconds.  No visual disturbances.  She has history of migraine headaches.  She would like to have this evaluated.  Neurology referral will be made.  Social history: She is married.  She currently is working for [employer] for [employer] and is a former Administrator, sports.  2 sons.  She and her husband contracted COVID-19 in October 2020 after a hunting trip to [place] but recovered.  Family history: Oldest son has ADD.  Review of Systems unable to take Zoloft or Wellbutrin.  She felt awful on both of these medications.  Continues to speak with marriage therapist on her own.     Objective:   Physical Exam Blood pressure 120/78 pulse 78 temperature 98.3 degrees orally pulse oximetry 97% weight 138 pounds BMI 20.98 Skin warm and dry.  No cervical adenopathy.  TMs are clear.  Neck supple without thyromegaly.  Chest clear to auscultation.  Breast and genital exam done today by Dr. Algie Coffer, GYN physician cardiac exam regular rate and rhythm 1/6 systolic ejection murmur.  Abdomen soft nondistended without hepatosplenomegaly.  No lower extremity edema.  Affect is anxious and somewhat disturbed over conversation with her husband earlier today.  Neuro intact  without focal deficits      Assessment & Plan:  Situational stress with marriage  Sharp quick pains right parietal area has patient concerned.  Will refer to neurologist for evaluation.  History of migraine headaches  Elevated LDL-continue to watch diet and exercise.  Plan: Return in 1 year or as needed.  Neurology referral will be placed.  In addition to her annual physical examination an additional 30 minutes was spent with her speaking about her domestic issues with husband and situational stress.  It was clear she was quite anxious today after having a conversation with him prior to coming to the office.

## 2019-06-19 NOTE — Patient Instructions (Addendum)
Antidepressants stopped due to side effects. Watch diet. Repeat lipid panel in one year.  Continue diet and exercise regimen.  Referral to Neurology Dr. Vickey Huger

## 2019-07-22 ENCOUNTER — Encounter: Payer: Self-pay | Admitting: Psychiatry

## 2019-07-22 ENCOUNTER — Ambulatory Visit (INDEPENDENT_AMBULATORY_CARE_PROVIDER_SITE_OTHER): Payer: No Typology Code available for payment source | Admitting: Psychiatry

## 2019-07-22 ENCOUNTER — Other Ambulatory Visit: Payer: Self-pay

## 2019-07-22 DIAGNOSIS — F4323 Adjustment disorder with mixed anxiety and depressed mood: Secondary | ICD-10-CM

## 2019-07-22 DIAGNOSIS — T50905D Adverse effect of unspecified drugs, medicaments and biological substances, subsequent encounter: Secondary | ICD-10-CM

## 2019-07-22 NOTE — Progress Notes (Signed)
[name] [id number] 04-20-1960 59 y.o.  Subjective:   Patient ID:  Martha Cabrera is a 59 y.o. (DOB 1960-03-12) female.  Chief Complaint:  Chief Complaint  Patient presents with  . Follow-up  . Medication Problem    HPI [name] presents to the office today for follow-up of change in meds.  As soon as stopped meds have felt completely strong and normal.  Needs family counseling bc family is falling apart.  Seen therapist [name] and that's helpful. Marital tx 2 years on and off.  He's said he's been miserable Started again with new therapist [name].  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  H believes she has narcissistic personality disoreder.  H is saying this to their son who is 31 yo.   Review of Systems:  Review of Systems  Neurological: Negative for tremors and weakness.    Medications: I have reviewed the patient's current medications.  Current Outpatient Medications  Medication Sig Dispense Refill  . calcium carbonate (OSCAL) 1500 (600 Ca) MG TABS tablet Take by mouth 2 (two) times daily with a meal.    . cholecalciferol (VITAMIN D3) 25 MCG (1000 UNIT) tablet Take 1,000 Units by mouth daily.    . SUMAtriptan (IMITREX) 100 MG tablet Take 1 tablet (100 mg total) by mouth as needed. (Patient not taking: Reported on 07/22/2019) 10 tablet 1   No current facility-administered medications for this visit.    Medication Side Effects: None  Allergies: No Known Allergies  History reviewed. No pertinent past medical history.  Family History  Problem Relation Age of Onset  . Depression Mother   . Diabetes Father   . Hyperlipidemia Father   . Hypertension Father   . Crohn's disease Father   . Hypertension Brother   . ADD / ADHD Son   . Depression Son   . Anxiety disorder Son   . Colon cancer Neg Hx   . Esophageal cancer Neg Hx   . Stomach cancer Neg Hx     Social History   Socioeconomic History  . Marital status: Married     Spouse name: Not on file  . Number of children: Not on file  . Years of education: Not on file  . Highest education level: Not on file  Occupational History  . Not on file  Tobacco Use  . Smoking status: Former Smoker    Years: 2.00    Types: Cigarettes    Quit date: 10/09/1977    Years since quitting: 41.8  . Smokeless tobacco: Never Used  Substance and Sexual Activity  . Alcohol use: Yes    Alcohol/week: 1.0 - 2.0 standard drinks    Types: 1 - 2 Glasses of wine per week    Comment: 1-2 glasses a wk or less  . Drug use: No  . Sexual activity: Yes    Birth control/protection: I.U.D.  Other Topics Concern  . Not on file  Social History Narrative  . Not on file   Social Determinants of Health   Financial Resource Strain:   . Difficulty of Paying Living Expenses:   Food Insecurity:   . Worried About Programme researcher, broadcasting/film/video in the Last Year:   . Barista in the Last Year:   Transportation Needs:   . Freight forwarder (Medical):   Marland Kitchen Lack of Transportation (Non-Medical):   Physical Activity:   . Days of Exercise per Week:   . Minutes of  Exercise per Session:   Stress:   . Feeling of Stress :   Social Connections:   . Frequency of Communication with Friends and Family:   . Frequency of Social Gatherings with Friends and Family:   . Attends Religious Services:   . Active Member of Clubs or Organizations:   . Attends Archivist Meetings:   Marland Kitchen Marital Status:   Intimate Partner Violence:   . Fear of Current or Ex-Partner:   . Emotionally Abused:   Marland Kitchen Physically Abused:   . Sexually Abused:     Past Medical History, Surgical history, Social history, and Family history were reviewed and updated as appropriate.   Please see review of systems for further details on the patient's review from today.   Objective:   Physical Exam:  There were no vitals taken for this visit.  Physical Exam Constitutional:      General: She is not in acute  distress. Musculoskeletal:        General: No deformity.  Neurological:     Mental Status: She is alert and oriented to person, place, and time.     Coordination: Coordination normal.  Psychiatric:        Attention and Perception: Attention and perception normal. She does not perceive auditory or visual hallucinations.        Mood and Affect: Mood is anxious. Mood is not depressed. Affect is not labile, blunt, angry or inappropriate.        Speech: Speech normal.        Behavior: Behavior normal.        Thought Content: Thought content normal. Thought content is not paranoid or delusional. Thought content does not include homicidal or suicidal ideation. Thought content does not include homicidal or suicidal plan.        Cognition and Memory: Cognition and memory normal.        Judgment: Judgment normal.     Comments: Insight intact Stressed but denies depression     Lab Review:     Component Value Date/Time   NA 141 04/30/2019 1721   K 5.4 (H) 04/30/2019 1721   CL 102 04/30/2019 1721   CO2 29 04/30/2019 1721   GLUCOSE 101 (H) 04/30/2019 1721   BUN 16 04/30/2019 1721   CREATININE 0.78 04/30/2019 1721   CALCIUM 10.0 04/30/2019 1721   PROT 7.2 04/30/2019 1721   PROT 6.7 08/12/2017 0931   ALBUMIN 4.5 08/12/2017 0931   AST 17 04/30/2019 1721   ALT 14 04/30/2019 1721   ALKPHOS 67 08/12/2017 0931   BILITOT 0.2 04/30/2019 1721   BILITOT 0.6 08/12/2017 0931   GFRNONAA 84 04/30/2019 1721   GFRAA 98 04/30/2019 1721       Component Value Date/Time   WBC 7.4 04/30/2019 1721   RBC 4.80 04/30/2019 1721   HGB 14.4 04/30/2019 1721   HCT 42.7 04/30/2019 1721   PLT 258 04/30/2019 1721   MCV 89.0 04/30/2019 1721   MCH 30.0 04/30/2019 1721   MCHC 33.7 04/30/2019 1721   RDW 12.7 04/30/2019 1721   LYMPHSABS 2,124 04/30/2019 1721   MONOABS 0.5 03/03/2012 1255   EOSABS 178 04/30/2019 1721   BASOSABS 59 04/30/2019 1721    No results found for: POCLITH, LITHIUM   No results found  for: PHENYTOIN, PHENOBARB, VALPROATE, CBMZ   .res Assessment: Plan:    Jola was seen today for follow-up and medication problem.  Diagnoses and all orders for this visit:  Adjustment disorder with mixed  anxiety and depressed mood  Medication side effect, subsequent encounter   30 min therapy session.   Discussed problems in the family and marriage. She wanted to process recent events from the last week.  Continued broken relationship with H.  She doesn't feel that she needs meds.  Denies depression sx. Anxiety sx are as expected in the situation.    No meds indicated.  Call if that situation changes.  The marriage is in jeopardy from both of their perspectives. Recommend contiue marital and individual therapy.Karmen Bongo, tim White if new therapist Horald Chestnut doesn't work out. . FU prn  Meredith Staggers, MD, DFAPA     Please see After Visit Summary for patient specific instructions.  No future appointments.  No orders of the defined types were placed in this encounter.   -------------------------------

## 2019-07-27 ENCOUNTER — Encounter: Payer: Self-pay | Admitting: Internal Medicine

## 2019-07-27 ENCOUNTER — Other Ambulatory Visit: Payer: Self-pay

## 2019-07-27 ENCOUNTER — Ambulatory Visit (INDEPENDENT_AMBULATORY_CARE_PROVIDER_SITE_OTHER): Payer: No Typology Code available for payment source | Admitting: Internal Medicine

## 2019-07-27 VITALS — BP 100/70 | HR 71 | Ht 68.0 in | Wt 134.0 lb

## 2019-07-27 DIAGNOSIS — T700XXA Otitic barotrauma, initial encounter: Secondary | ICD-10-CM

## 2019-07-27 DIAGNOSIS — T7029XA Other effects of high altitude, initial encounter: Secondary | ICD-10-CM

## 2019-07-27 NOTE — Progress Notes (Signed)
   Subjective:    Patient ID: Martha Cabrera, female    DOB: 05/16/1960, 59 y.o.   MRN: [id number]  HPI 60 year old Female has been taking scuba diving lessons recently.  Plans on family vacation in the near future with scuba diving is an activity.  She saw Aquilla Hacker on May 27 at ENT clinic regarding ear discomfort.  Had some trouble getting ears to pop when she was in the pool taking scuba diving lessons prior to that visit.  Cerumen noted in right ear which was removed left ear canal and TM was within normal limits.  Subsequently after earwax was removed from right ear it was noted both TMs moved well with pneumatic otoscope.  However, patient subsequently went [place] her certification and scuba diving.  It was very murky water that she was diving in.  Once again, had considerable issues with equalization and ears have been stopped up ever since.  She was unable to get an appointment urgently to be seen with ENT physician today.  We called for her and  I got Dr. Pollyann Kennedy on the phone just after 5 PM.  He discussed the case with me at length.   He indicated that they could see her in a couple of weeks, but they were short staffed due to vacations and retirements of physicians.  We talked about options until she can be checked there and this included decongestants.  Antibiotics were not recommended.  I shared this information with patient.  Also he indicated that it might not be advisable that she proceed with scuba diving certification given she has had 2 episodes with issues equalizing.  I shared this information with patient as well.  Patient has situational stress with marriage.  He is seeing Dr. Jennelle Human and also a counselor.  Was looking forward to family vacation which includes diving.  History of migraine headaches.  She says if scuba diving is not feasible then she will just do snorkeling.   Review of Systems see above she has no fever     Objective:   Physical Exam Blood  pressure 100/70 pulse 71 pulse oximetry 98% weight 138 pounds height 5 feet 8 inches BMI 20.37  TMs are slightly full bilaterally and there appears to be a visible tiny blood vessel in the central TM.  Neck is supple.  Chest clear to auscultation.  Pharynx is clear.  No cerumen present in external canals.       Assessment & Plan:  ?  Barotrauma from recent scuba diving experience  Plan: As discussed per Dr. Pollyann Kennedy patient will proceed with decongestants and will keep follow-up appointment with the ENT that she already had for late June which will be prior to her scuba diving trip.  Did not scuba dive in the interim.

## 2019-08-13 ENCOUNTER — Telehealth: Payer: Self-pay | Admitting: Internal Medicine

## 2019-08-13 NOTE — Telephone Encounter (Signed)
Martha Cabrera called to say she missed her appointment with ENT today because of a mix up, and was wandering if you could see her to give her clearence to go scuba diving. She is currently waiting on a phone call from Dr Jenne Pane office to see if they could possible work her back in. I did let her know your schedule is pretty tight for the next 2 weeks. I also ask her to call us once she heard back from Dr Jenne Pane office since she stated she needed clearance from ENT.

## 2019-08-13 NOTE — Telephone Encounter (Signed)
Called and let patient know what Dr Baxley said and she verbalized understanding. 

## 2019-08-13 NOTE — Telephone Encounter (Signed)
No, I cannot clear her. I spent a lot of time and effort getting on call ENT to speak with me that day. He felt strongly she should be seen.

## 2019-08-16 NOTE — Patient Instructions (Addendum)
Keep appointment with ENT physician in late June.  Do not scuba dive in the interim.  Take over-the-counter decongestants as recommended by on-call ENT physician that I was able to consult over the phone.

## 2019-09-09 ENCOUNTER — Other Ambulatory Visit: Payer: Self-pay | Admitting: *Deleted

## 2019-09-09 ENCOUNTER — Ambulatory Visit: Payer: No Typology Code available for payment source | Attending: Internal Medicine

## 2019-09-09 DIAGNOSIS — Z20822 Contact with and (suspected) exposure to covid-19: Secondary | ICD-10-CM

## 2019-09-09 NOTE — Progress Notes (Unsigned)
lab

## 2019-09-10 LAB — SARS-COV-2, NAA 2 DAY TAT

## 2019-09-10 LAB — NOVEL CORONAVIRUS, NAA: SARS-CoV-2, NAA: NOT DETECTED

## 2020-01-18 ENCOUNTER — Telehealth: Payer: Self-pay | Admitting: Internal Medicine

## 2020-01-18 NOTE — Telephone Encounter (Signed)
She should consider Shingrix  vaccine from pharmacy which is  the shingles vaccine even though she does not remember having chicken pox. Not recommended to have chicken pox vaccine at her age. Not due for pneumonia vaccine until age 59 as she is healthy.

## 2020-01-18 NOTE — Telephone Encounter (Signed)
Pt never had chicken pox as a kid, pt was wondering if she should get the chicken pox vaccine and she also wanted to know if she should get the pneumonia vaccine. Please advise

## 2020-01-18 NOTE — Telephone Encounter (Signed)
Patient notified

## 2020-04-12 ENCOUNTER — Other Ambulatory Visit: Payer: Self-pay | Admitting: Obstetrics

## 2020-04-12 DIAGNOSIS — Z1231 Encounter for screening mammogram for malignant neoplasm of breast: Secondary | ICD-10-CM

## 2020-06-01 ENCOUNTER — Ambulatory Visit: Payer: No Typology Code available for payment source

## 2020-06-14 ENCOUNTER — Other Ambulatory Visit: Payer: No Typology Code available for payment source | Admitting: Internal Medicine

## 2020-06-14 ENCOUNTER — Other Ambulatory Visit: Payer: Self-pay

## 2020-06-14 DIAGNOSIS — Z1321 Encounter for screening for nutritional disorder: Secondary | ICD-10-CM

## 2020-06-14 DIAGNOSIS — E7800 Pure hypercholesterolemia, unspecified: Secondary | ICD-10-CM

## 2020-06-14 DIAGNOSIS — Z0000 Encounter for general adult medical examination without abnormal findings: Secondary | ICD-10-CM

## 2020-06-14 DIAGNOSIS — Z1329 Encounter for screening for other suspected endocrine disorder: Secondary | ICD-10-CM

## 2020-06-14 DIAGNOSIS — E785 Hyperlipidemia, unspecified: Secondary | ICD-10-CM

## 2020-06-14 DIAGNOSIS — F439 Reaction to severe stress, unspecified: Secondary | ICD-10-CM

## 2020-06-14 DIAGNOSIS — F419 Anxiety disorder, unspecified: Secondary | ICD-10-CM

## 2020-06-14 DIAGNOSIS — F32A Depression, unspecified: Secondary | ICD-10-CM

## 2020-06-15 LAB — CBC WITH DIFFERENTIAL/PLATELET
Absolute Monocytes: 499 cells/uL (ref 200–950)
Basophils Absolute: 29 cells/uL (ref 0–200)
Basophils Relative: 0.6 %
Eosinophils Absolute: 158 cells/uL (ref 15–500)
Eosinophils Relative: 3.3 %
HCT: 40.8 % (ref 35.0–45.0)
Hemoglobin: 13.5 g/dL (ref 11.7–15.5)
Lymphs Abs: 1618 cells/uL (ref 850–3900)
MCH: 29.9 pg (ref 27.0–33.0)
MCHC: 33.1 g/dL (ref 32.0–36.0)
MCV: 90.3 fL (ref 80.0–100.0)
MPV: 10.9 fL (ref 7.5–12.5)
Monocytes Relative: 10.4 %
Neutro Abs: 2496 cells/uL (ref 1500–7800)
Neutrophils Relative %: 52 %
Platelets: 224 10*3/uL (ref 140–400)
RBC: 4.52 10*6/uL (ref 3.80–5.10)
RDW: 13 % (ref 11.0–15.0)
Total Lymphocyte: 33.7 %
WBC: 4.8 10*3/uL (ref 3.8–10.8)

## 2020-06-15 LAB — LIPID PANEL
Cholesterol: 229 mg/dL — ABNORMAL HIGH (ref <200)
HDL: 80 mg/dL (ref >=50)
LDL Cholesterol (Calc): 136 mg/dL (calc) — ABNORMAL HIGH
Non-HDL Cholesterol (Calc): 149 mg/dL (calc) — ABNORMAL HIGH (ref <130)
Total CHOL/HDL Ratio: 2.9 (calc) (ref <5.0)
Triglycerides: 42 mg/dL (ref <150)

## 2020-06-15 LAB — COMPLETE METABOLIC PANEL WITH GFR
AG Ratio: 1.8 (calc) (ref 1.0–2.5)
ALT: 21 U/L (ref 6–29)
AST: 24 U/L (ref 10–35)
Albumin: 4.2 g/dL (ref 3.6–5.1)
Alkaline phosphatase (APISO): 71 U/L (ref 37–153)
BUN: 17 mg/dL (ref 7–25)
CO2: 31 mmol/L (ref 20–32)
Calcium: 9.3 mg/dL (ref 8.6–10.4)
Chloride: 104 mmol/L (ref 98–110)
Creat: 0.7 mg/dL (ref 0.50–1.05)
GFR, Est African American: 111 mL/min/{1.73_m2} (ref >=60)
GFR, Est Non African American: 96 mL/min/{1.73_m2} (ref >=60)
Globulin: 2.3 g/dL (calc) (ref 1.9–3.7)
Glucose, Bld: 86 mg/dL (ref 65–99)
Potassium: 5.1 mmol/L (ref 3.5–5.3)
Sodium: 140 mmol/L (ref 135–146)
Total Bilirubin: 0.6 mg/dL (ref 0.2–1.2)
Total Protein: 6.5 g/dL (ref 6.1–8.1)

## 2020-06-15 LAB — TSH: TSH: 1.77 mIU/L (ref 0.40–4.50)

## 2020-06-15 LAB — VITAMIN D 25 HYDROXY (VIT D DEFICIENCY, FRACTURES): Vit D, 25-Hydroxy: 28 ng/mL — ABNORMAL LOW (ref 30–100)

## 2020-06-21 ENCOUNTER — Encounter: Payer: No Typology Code available for payment source | Admitting: Internal Medicine

## 2020-06-21 ENCOUNTER — Ambulatory Visit (INDEPENDENT_AMBULATORY_CARE_PROVIDER_SITE_OTHER): Payer: No Typology Code available for payment source | Admitting: Internal Medicine

## 2020-06-21 ENCOUNTER — Other Ambulatory Visit: Payer: Self-pay

## 2020-06-21 ENCOUNTER — Encounter: Payer: Self-pay | Admitting: Internal Medicine

## 2020-06-21 VITALS — BP 108/70 | HR 73 | Ht 68.0 in | Wt 139.0 lb

## 2020-06-21 DIAGNOSIS — Z8669 Personal history of other diseases of the nervous system and sense organs: Secondary | ICD-10-CM | POA: Diagnosis not present

## 2020-06-21 DIAGNOSIS — E7800 Pure hypercholesterolemia, unspecified: Secondary | ICD-10-CM | POA: Diagnosis not present

## 2020-06-21 DIAGNOSIS — Z23 Encounter for immunization: Secondary | ICD-10-CM | POA: Diagnosis not present

## 2020-06-21 DIAGNOSIS — E559 Vitamin D deficiency, unspecified: Secondary | ICD-10-CM | POA: Diagnosis not present

## 2020-06-21 DIAGNOSIS — Z0000 Encounter for general adult medical examination without abnormal findings: Secondary | ICD-10-CM

## 2020-06-21 LAB — POCT URINALYSIS DIPSTICK
Appearance: NEGATIVE
Bilirubin, UA: NEGATIVE
Blood, UA: NEGATIVE
Glucose, UA: NEGATIVE
Ketones, UA: NEGATIVE
Leukocytes, UA: NEGATIVE
Nitrite, UA: NEGATIVE
Odor: NEGATIVE
Protein, UA: NEGATIVE
Spec Grav, UA: 1.015 (ref 1.010–1.025)
Urobilinogen, UA: 0.2 E.U./dL
pH, UA: 6.5 (ref 5.0–8.0)

## 2020-06-21 MED ORDER — ROSUVASTATIN CALCIUM 5 MG PO TABS: 5.0000 mg | ORAL_TABLET | Freq: Every day | ORAL | 3 refills | Status: DC

## 2020-06-21 MED ORDER — ERGOCALCIFEROL 1.25 MG (50000 UT) PO CAPS: 50000.0000 [IU] | ORAL_CAPSULE | ORAL | 0 refills | Status: DC

## 2020-06-21 MED ORDER — ROSUVASTATIN CALCIUM 5 MG PO TABS: ORAL_TABLET | ORAL | 3 refills | Status: DC

## 2020-06-21 NOTE — Patient Instructions (Addendum)
Tetanus immunization update given.  Start Crestor 5 mg 3 times a week.  Take vitamin D 50,000 units weekly for 12 weeks.  In 3 months, return for fasting lipid panel, liver functions and vitamin D level followed by office visit.  Mammogram to be done in the near future-patient has appointment already  Colonoscopy due next year.  Have Shingrix vaccine through pharmacy.  Have COVID booster in the Fall unless traveling this summer

## 2020-06-21 NOTE — Progress Notes (Signed)
   Subjective:    Patient ID: Martha Cabrera, female    DOB: May 10, 1960, 60 y.o.   MRN: [id number]  HPI 60 year old Female for health maintenance exam and evaluation of medical issues.  Last seen here April 2021.  She and her husband are getting along much better.  Therapy has helped.  Social history: She is married.  Currently working for [employer] for [employer] and is a former Administrator, sports.  2 sons.  She and her husband contracted COVID-19 in October 2020 after hunting trip to [place] but recovered quickly.  Family history: Father with history of hypertension age 19.  No history of heart disease.  Mother living age 34.  In July 2021 had issues with eustachian tube dysfunction.  While scuba diving at the time.  Says she now knows how to clear her ears and this issue has resolved.  Labs are reviewed and her vitamin D level is low at 28.  She will be placed on weekly vitamin D for 12 weeks.  She has elevated total cholesterol at 229 and was 210 a year ago.  LDL was 136 and a year ago was 129.  This is likely inherited.  Her weight is good and she is physically active.  Triglycerides are normal.  Her HDL is excellent at 80.  She is going to start Crestor 5 mg 3 times a week with follow-up in 3 months.        Review of Systems no new complaints today- she feels well     Objective:   Physical Exam Blood pressure 108/70 pulse 73 pulse oximetry 96% weight 139 pounds BMI 21.13  Skin: Warm and dry.  No cervical adenopathy.  No thyromegaly.  No carotid bruits.  TMs clear.  Neck is supple.  Chest clear to auscultation.  Breasts are without masses.  Cardiac exam regular rate and rhythm.  No murmurs or gallops.  Abdomen soft nondistended without hepatosplenomegaly masses or tenderness.  No lower extremity pitting edema.  Affect thought and judgment are normal.       Assessment & Plan:   Hypercholesterolemia-likely inherited.  She will start Crestor 5 mg 3 times  weekly and follow-up in 3 months.  History of migraine headaches-improved  Vitamin D deficiency-will be on high-dose vitamin D weekly for 12 weeks and vitamin D level will be rechecked in August as well.  Colonoscopy is due next year.  Had Shingrix vaccine through your local pharmacy.  Recommend COVID booster in the fall unless you are traveling this summer.  Mammogram to be done in the near future.  Patient has appointment already.  Tetanus update given today.

## 2020-06-30 ENCOUNTER — Telehealth: Payer: Self-pay

## 2020-06-30 NOTE — Telephone Encounter (Signed)
She said she will do a home COVID test and call back with results.

## 2020-06-30 NOTE — Telephone Encounter (Signed)
She needs to do a Home Covid test and call us back with results

## 2020-06-30 NOTE — Telephone Encounter (Signed)
Yesterday morning woke up with diarrhea, vomiting, fever 100.4. This morning she woke congested and weak. She ate 1/2 a banana and a bagel. She was wondering, if she can come in for a flu test and covid test. Someone in her office has the same thing going on. She has not traveled. No fever this morning.

## 2020-07-20 ENCOUNTER — Other Ambulatory Visit: Payer: Self-pay

## 2020-07-20 ENCOUNTER — Ambulatory Visit
Admission: RE | Admit: 2020-07-20 | Discharge: 2020-07-20 | Disposition: A | Discharge status: Home / Self Care | Payer: No Typology Code available for payment source | Source: Ambulatory Visit | Attending: Obstetrics | Admitting: Obstetrics

## 2020-07-20 DIAGNOSIS — Z1231 Encounter for screening mammogram for malignant neoplasm of breast: Secondary | ICD-10-CM

## 2020-08-26 ENCOUNTER — Encounter: Payer: Self-pay | Admitting: Internal Medicine

## 2020-08-26 ENCOUNTER — Other Ambulatory Visit: Payer: Self-pay

## 2020-08-26 ENCOUNTER — Ambulatory Visit (INDEPENDENT_AMBULATORY_CARE_PROVIDER_SITE_OTHER): Payer: No Typology Code available for payment source | Admitting: Internal Medicine

## 2020-08-26 VITALS — BP 130/80 | HR 66 | Temp 98.7°F | Ht 68.5 in | Wt 135.0 lb

## 2020-08-26 DIAGNOSIS — H1032 Unspecified acute conjunctivitis, left eye: Secondary | ICD-10-CM | POA: Diagnosis not present

## 2020-08-26 MED ORDER — OFLOXACIN 0.3 % OP SOLN: 2.0000 [drp] | Freq: Four times a day (QID) | OPHTHALMIC | 1 refills | Status: DC

## 2020-08-26 NOTE — Progress Notes (Signed)
[facility]   Subjective:    Patient ID: Martha Cabrera, female    DOB: [birth year], 60 y.o.   MRN: [id number]  HPI 60 year old Female seen today with inflammation and drainage left eye for slightly over a week. Symptoms started prior to her vacation and have worsened with drainage in corner of eye, redness and tearing. No fever. No URI symptoms. Interestingly her dogs also have similar symptoms but have been boarded recently.    Review of Systems see above. No fever, headache, chills, nausea vomiting or visual disturbance     Objective:   Physical Exam BP 130/80 pulse 66 T 98.7 degrees pulse ox 96% weight 135 pounds BMI 20.23  PERL, extraocular movements full in left eye. Inflamed conjunctiva left eye. Right eye is normal,       Assessment & Plan:    Acute Left eye conjunctivitis    Plan: Ocuflox ophthalmic drops to use 2 drops left eye 4 times a day x 5-7 days with one refill. Warm compresses to left eye 20 minutes 3 times a day until improved.

## 2020-08-26 NOTE — Patient Instructions (Signed)
Apply warm compresses to left  eye for 20 minutes 3 times a day until improved.  Ocuflox ophthalmic drops use 2 drops in left eye 4 times a day for 5 to 7 days.

## 2020-09-16 ENCOUNTER — Other Ambulatory Visit: Payer: Self-pay

## 2020-09-16 ENCOUNTER — Other Ambulatory Visit: Payer: No Typology Code available for payment source | Admitting: Internal Medicine

## 2020-09-16 DIAGNOSIS — E559 Vitamin D deficiency, unspecified: Secondary | ICD-10-CM

## 2020-09-16 DIAGNOSIS — E7800 Pure hypercholesterolemia, unspecified: Secondary | ICD-10-CM

## 2020-09-16 LAB — HEPATIC FUNCTION PANEL
AG Ratio: 1.7 (calc) (ref 1.0–2.5)
ALT: 15 U/L (ref 6–29)
AST: 21 U/L (ref 10–35)
Albumin: 4.4 g/dL (ref 3.6–5.1)
Alkaline phosphatase (APISO): 65 U/L (ref 37–153)
Bilirubin, Direct: 0.1 mg/dL (ref 0.0–0.2)
Globulin: 2.6 g/dL (calc) (ref 1.9–3.7)
Indirect Bilirubin: 0.4 mg/dL (calc) (ref 0.2–1.2)
Total Bilirubin: 0.5 mg/dL (ref 0.2–1.2)
Total Protein: 7 g/dL (ref 6.1–8.1)

## 2020-09-16 LAB — LIPID PANEL
Cholesterol: 199 mg/dL (ref <200)
HDL: 82 mg/dL (ref >=50)
LDL Cholesterol (Calc): 103 mg/dL (calc) — ABNORMAL HIGH
Non-HDL Cholesterol (Calc): 117 mg/dL (calc) (ref <130)
Total CHOL/HDL Ratio: 2.4 (calc) (ref <5.0)
Triglycerides: 48 mg/dL (ref <150)

## 2020-09-16 LAB — VITAMIN D 25 HYDROXY (VIT D DEFICIENCY, FRACTURES): Vit D, 25-Hydroxy: 81 ng/mL (ref 30–100)

## 2020-09-19 ENCOUNTER — Other Ambulatory Visit: Payer: No Typology Code available for payment source | Admitting: Internal Medicine

## 2020-09-22 ENCOUNTER — Other Ambulatory Visit: Payer: Self-pay

## 2020-09-22 ENCOUNTER — Encounter: Payer: Self-pay | Admitting: Internal Medicine

## 2020-09-22 ENCOUNTER — Ambulatory Visit (INDEPENDENT_AMBULATORY_CARE_PROVIDER_SITE_OTHER): Payer: No Typology Code available for payment source | Admitting: Internal Medicine

## 2020-09-22 ENCOUNTER — Telehealth: Payer: Self-pay | Admitting: Internal Medicine

## 2020-09-22 VITALS — BP 98/60 | HR 68 | Ht 68.5 in | Wt 136.0 lb

## 2020-09-22 DIAGNOSIS — E7800 Pure hypercholesterolemia, unspecified: Secondary | ICD-10-CM

## 2020-09-22 MED ORDER — ERGOCALCIFEROL 1.25 MG (50000 UT) PO CAPS: 50000.0000 [IU] | ORAL_CAPSULE | ORAL | 0 refills | Status: DC

## 2020-09-22 NOTE — Patient Instructions (Addendum)
Increase Crestor to 5 mg daily and follow-up in 3 months with office visit.  CT calcium score ordered.

## 2020-09-22 NOTE — Progress Notes (Signed)
   Subjective:    Patient ID: Martha Cabrera, female    DOB: [birth year], 59 y.o.   MRN: [id number]  HPI 60 year old Female seen for follow up of hyperlipidemia.  At time of health maintenance exam in April of this year, total cholesterol was 229 and LDL was 136.    Last year, total cholesterol was 210 with an LDL cholesterol of 129.  She is physically active and her weight is acceptable at 136 pounds. BMI is 20.38.   In April 2022, Triglycerides were normal and HDL excellent at 80.  She was  started on Crestor 5 mg 3 times a week, and she is now here for follow-up.    LDL cholesterol has improved from 136 to 103.  Total cholesterol has improved from 229 to 199  HDL increased slightly from 80 to 82.  Triglycerides are low at 48.  I would like her to increase Crestor to 5 mg daily.  Hopefully this will get the LDL under 100.  She has made some dietary changes as well.  She is asking about Coronary artery disease screening.  We talked about CT cardiac scoring.  She is interested in this and is willing to pay out-of-pocket.  We will arrange for this.     Review of Systems see above-no new complaints      Objective:   Physical Exam Blood pressure 98/60 pulse 68 pulse oximetry 97% weight 136 pounds BMI 20.48  Reviewed fasting lipid panel with her in detail.  No side effects taking low-dose Crestor.  Feels well.        Assessment & Plan:  Pure hypercholesterolemia with improvement in total and LDL cholesterol with Crestor 5 mg 3 times a week.  However, goal is to get LDL less than 100.  Currently it is 103 ,and we will increase to 5 mg weekly with follow-up in 3 months. CT Cardiac Scoring ordered.

## 2020-09-22 NOTE — Telephone Encounter (Signed)
Martha Cabrera forgot to ask if she should continue taking 50,000 units once a week of her Vit D.

## 2020-11-30 ENCOUNTER — Other Ambulatory Visit: Payer: Self-pay | Admitting: Internal Medicine

## 2020-12-13 ENCOUNTER — Other Ambulatory Visit: Payer: Self-pay

## 2020-12-13 ENCOUNTER — Ambulatory Visit (HOSPITAL_BASED_OUTPATIENT_CLINIC_OR_DEPARTMENT_OTHER)
Admission: RE | Admit: 2020-12-13 | Discharge: 2020-12-13 | Disposition: A | Discharge status: Home / Self Care | Payer: No Typology Code available for payment source | Source: Ambulatory Visit | Attending: Internal Medicine | Admitting: Internal Medicine

## 2020-12-13 ENCOUNTER — Ambulatory Visit: Payer: No Typology Code available for payment source | Attending: Internal Medicine

## 2020-12-13 ENCOUNTER — Other Ambulatory Visit (HOSPITAL_BASED_OUTPATIENT_CLINIC_OR_DEPARTMENT_OTHER): Payer: Self-pay

## 2020-12-13 DIAGNOSIS — Z23 Encounter for immunization: Secondary | ICD-10-CM

## 2020-12-13 DIAGNOSIS — Z136 Encounter for screening for cardiovascular disorders: Secondary | ICD-10-CM | POA: Insufficient documentation

## 2020-12-13 MED ORDER — PFIZER COVID-19 VAC BIVALENT 30 MCG/0.3ML IM SUSP
INTRAMUSCULAR | 0 refills | Status: DC
  Filled 2020-12-13: qty 0.3, 1d supply, fill #0

## 2020-12-13 MED ORDER — INFLUENZA VAC SPLIT QUAD 0.5 ML IM SUSY
PREFILLED_SYRINGE | INTRAMUSCULAR | 0 refills | Status: DC
  Filled 2020-12-13: qty 0.5, 1d supply, fill #0

## 2020-12-13 NOTE — Progress Notes (Signed)
   Covid-19 Vaccination Clinic  Name:  Martha Cabrera    MRN: [id number] DOB: [birth year]  12/13/2020  Ms. Armistead was observed post Covid-19 immunization for 15 minutes without incident. She was provided with Vaccine Information Sheet and instruction to access the V-Safe system.   Ms. Blahnik was instructed to call 911 with any severe reactions post vaccine: Difficulty breathing  Swelling of face and throat  A fast heartbeat  A bad rash all over body  Dizziness and weakness   Immunizations Administered     Name Date Dose VIS Date Route   Pfizer Covid-19 Vaccine Bivalent Booster 12/13/2020  2:01 PM 0.3 mL 10/19/2020 Intramuscular   Manufacturer: [employer], [employer]   Lot: [id number]   NDC: [id number]

## 2020-12-23 ENCOUNTER — Other Ambulatory Visit: Payer: No Typology Code available for payment source | Admitting: Internal Medicine

## 2020-12-23 ENCOUNTER — Other Ambulatory Visit: Payer: Self-pay

## 2020-12-23 DIAGNOSIS — E7800 Pure hypercholesterolemia, unspecified: Secondary | ICD-10-CM

## 2020-12-23 DIAGNOSIS — F419 Anxiety disorder, unspecified: Secondary | ICD-10-CM

## 2020-12-23 DIAGNOSIS — F32A Depression, unspecified: Secondary | ICD-10-CM

## 2020-12-23 DIAGNOSIS — R5383 Other fatigue: Secondary | ICD-10-CM

## 2020-12-23 LAB — CBC WITH DIFFERENTIAL/PLATELET
Absolute Monocytes: 480 cells/uL (ref 200–950)
Basophils Absolute: 48 cells/uL (ref 0–200)
Basophils Relative: 0.8 %
Eosinophils Absolute: 168 cells/uL (ref 15–500)
Eosinophils Relative: 2.8 %
HCT: 43.5 % (ref 35.0–45.0)
Hemoglobin: 14.2 g/dL (ref 11.7–15.5)
Lymphs Abs: 1620 cells/uL (ref 850–3900)
MCH: 29.5 pg (ref 27.0–33.0)
MCHC: 32.6 g/dL (ref 32.0–36.0)
MCV: 90.4 fL (ref 80.0–100.0)
MPV: 10.3 fL (ref 7.5–12.5)
Monocytes Relative: 8 %
Neutro Abs: 3684 cells/uL (ref 1500–7800)
Neutrophils Relative %: 61.4 %
Platelets: 247 10*3/uL (ref 140–400)
RBC: 4.81 10*6/uL (ref 3.80–5.10)
RDW: 12.1 % (ref 11.0–15.0)
Total Lymphocyte: 27 %
WBC: 6 10*3/uL (ref 3.8–10.8)

## 2020-12-23 LAB — COMPLETE METABOLIC PANEL WITH GFR
AG Ratio: 1.7 (calc) (ref 1.0–2.5)
ALT: 25 U/L (ref 6–29)
AST: 26 U/L (ref 10–35)
Albumin: 4.5 g/dL (ref 3.6–5.1)
Alkaline phosphatase (APISO): 71 U/L (ref 37–153)
BUN: 25 mg/dL (ref 7–25)
CO2: 34 mmol/L — ABNORMAL HIGH (ref 20–32)
Calcium: 9.9 mg/dL (ref 8.6–10.4)
Chloride: 102 mmol/L (ref 98–110)
Creat: 0.74 mg/dL (ref 0.50–1.03)
Globulin: 2.7 g/dL (calc) (ref 1.9–3.7)
Glucose, Bld: 80 mg/dL (ref 65–99)
Potassium: 5.2 mmol/L (ref 3.5–5.3)
Sodium: 141 mmol/L (ref 135–146)
Total Bilirubin: 0.6 mg/dL (ref 0.2–1.2)
Total Protein: 7.2 g/dL (ref 6.1–8.1)
eGFR: 93 mL/min/{1.73_m2} (ref >=60)

## 2020-12-23 LAB — LIPID PANEL
Cholesterol: 174 mg/dL (ref <200)
HDL: 76 mg/dL (ref >=50)
LDL Cholesterol (Calc): 84 mg/dL (calc)
Non-HDL Cholesterol (Calc): 98 mg/dL (calc) (ref <130)
Total CHOL/HDL Ratio: 2.3 (calc) (ref <5.0)
Triglycerides: 56 mg/dL (ref <150)

## 2020-12-26 ENCOUNTER — Other Ambulatory Visit: Payer: No Typology Code available for payment source | Admitting: Internal Medicine

## 2020-12-29 ENCOUNTER — Ambulatory Visit: Payer: No Typology Code available for payment source | Admitting: Internal Medicine

## 2020-12-29 ENCOUNTER — Other Ambulatory Visit: Payer: Self-pay

## 2020-12-29 ENCOUNTER — Ambulatory Visit (INDEPENDENT_AMBULATORY_CARE_PROVIDER_SITE_OTHER): Payer: No Typology Code available for payment source | Admitting: Internal Medicine

## 2020-12-29 ENCOUNTER — Encounter: Payer: Self-pay | Admitting: Internal Medicine

## 2020-12-29 VITALS — BP 102/62 | HR 97 | Temp 97.3°F | Resp 16 | Ht 68.5 in | Wt 138.0 lb

## 2020-12-29 DIAGNOSIS — Z23 Encounter for immunization: Secondary | ICD-10-CM

## 2020-12-29 DIAGNOSIS — E7800 Pure hypercholesterolemia, unspecified: Secondary | ICD-10-CM

## 2020-12-29 NOTE — Progress Notes (Signed)
   Subjective:    Patient ID: Martha Cabrera, female    DOB: 1960/10/05, 60 y.o.   MRN: [id number]  HPI 60 year old Female had coronary calcium score in October.  Her calcium score was excellent at 0.  Her coronary arteries appear to be normal.  Ascending aorta had normal caliber.  Patient wanted to discuss these results as they were confusing to her.  She has a history of pure hypercholesterolemia.  In April 2022 her total cholesterol was 229 and LDL cholesterol 136.  She was started on Crestor 5 mg daily in May.  Review of Systems no acute complaints     Objective:   Physical Exam Vital signs reviewed.  Chest clear to auscultation.  Cardiac exam: Regular rate and rhythm without ectopy.  No lower extremity edema.       Assessment & Plan:  She has an excellent coronary calcium score of 0  She would like to recheck her lipid panel in 3 months.

## 2020-12-29 NOTE — Progress Notes (Signed)
   Subjective:    Patient ID: Martha Cabrera, female    DOB: Nov 10, 1960, 60 y.o.   MRN: [id number]  HPI    Review of Systems     Objective:   Physical Exam        Assessment & Plan:

## 2021-02-17 DIAGNOSIS — E7800 Pure hypercholesterolemia, unspecified: Secondary | ICD-10-CM | POA: Insufficient documentation

## 2021-02-17 NOTE — Patient Instructions (Addendum)
Continue diet and exercise efforts as well as Crestor.  Follow-up in 3 months.  You have an excellent coronary calcium score.  Flu vaccine given

## 2021-03-17 ENCOUNTER — Other Ambulatory Visit (INDEPENDENT_AMBULATORY_CARE_PROVIDER_SITE_OTHER): Payer: No Typology Code available for payment source | Admitting: Internal Medicine

## 2021-03-17 ENCOUNTER — Other Ambulatory Visit: Payer: Self-pay

## 2021-03-17 DIAGNOSIS — E7800 Pure hypercholesterolemia, unspecified: Secondary | ICD-10-CM

## 2021-03-18 LAB — LIPID PANEL
Cholesterol: 233 mg/dL — ABNORMAL HIGH (ref <200)
HDL: 80 mg/dL (ref >=50)
LDL Cholesterol (Calc): 138 mg/dL (calc) — ABNORMAL HIGH
Non-HDL Cholesterol (Calc): 153 mg/dL (calc) — ABNORMAL HIGH (ref <130)
Total CHOL/HDL Ratio: 2.9 (calc) (ref <5.0)
Triglycerides: 55 mg/dL (ref <150)

## 2021-03-21 ENCOUNTER — Other Ambulatory Visit: Payer: Self-pay

## 2021-03-21 ENCOUNTER — Ambulatory Visit (INDEPENDENT_AMBULATORY_CARE_PROVIDER_SITE_OTHER): Payer: No Typology Code available for payment source | Admitting: Internal Medicine

## 2021-03-21 ENCOUNTER — Encounter: Payer: Self-pay | Admitting: Internal Medicine

## 2021-03-21 VITALS — BP 108/68 | HR 77 | Temp 97.3°F | Ht 68.5 in | Wt 140.8 lb

## 2021-03-21 DIAGNOSIS — E7800 Pure hypercholesterolemia, unspecified: Secondary | ICD-10-CM | POA: Diagnosis not present

## 2021-03-21 MED ORDER — ROSUVASTATIN CALCIUM 10 MG PO TABS: 10.0000 mg | ORAL_TABLET | Freq: Every day | ORAL | 3 refills | Status: DC

## 2021-03-21 NOTE — Patient Instructions (Addendum)
Patient will take Crestor 10 mg daily and follow-up at time of her physical exam with fasting labs including lipid panel in June.

## 2021-03-21 NOTE — Progress Notes (Signed)
° °  Subjective:    Patient ID: Martha Cabrera, female    DOB: December 10, 1960, 61 y.o.   MRN: [id number]  HPI 61 year old Female in today to discuss lipid management.  In late April 2022, her triglycerides were normal and her HDL was excellent at 80.  Her total cholesterol was 229 and her LDL was 136.  In April 2021 total cholesterol was 210, HDL 65, triglycerides 64 and LDL 129.  In early May 2022 she was started on Crestor 5 mg 3 times a week.  In July 2022 her lipids had essentially normalized.  Total cholesterol was 199 and LDL cholesterol 103.  HDL was 82 and triglycerides 48.  I advised her in early August to increase Crestor to 5 mg daily to get the LDL under 100.  She had made some dietary changes as well.  She asked about coronary artery screening at that time.  She was willing to pay out-of-pocket.  In late October she had coronary calcium scoring and the result is an excellent score of 0.  Her lipid panel December 23, 2020 was completely normal on Crestor 10 mg daily.  She then wanted to go off of her statin medication to see how things were going.  She is now here for follow-up and had lipid panel on January 27.  Her total cholesterol has increased to 233 and LDL cholesterol 138.  HDL cholesterol is 80 and triglycerides 55.  Says she is eating about the same.  She watches her weight.  She is not overweight.  She exercises regularly.  We have discussed today what to do in view of this.  I think although her coronary calcium score is 0 there is benefit in her taking Crestor 10 mg daily.  She had no side effects from this.  She has been scheduled for CPE appointment in June 2023.  Although she has a low coronary calcium score I think there is benefit in keeping lipids at a normal level.  In addition to cardiac benefits statin medication may help prevent stroke as well.         Review of Systems see above     Objective:   Physical Exam Blood pressure 108/68 pulse 77 temperature 97.3  pulse oximetry 99% weight 140 pounds 12 ounces BMI 21.09  Seen in no acute distress.  Her BMI is good.     Assessment & Plan:   Prolonged discussion today about her results.  Reviewed coronary calcium score.  I think she should stay on Crestor 10 mg daily.  She has  appointment for health maintenance exam scheduled for June 2023.  Time spent discussing results, considering benefits of statin medication versus low side effects is 25 minutes

## 2021-03-23 ENCOUNTER — Telehealth: Payer: Self-pay | Admitting: Internal Medicine

## 2021-03-23 MED ORDER — ROSUVASTATIN CALCIUM 5 MG PO TABS: 5.0000 mg | ORAL_TABLET | Freq: Every day | ORAL | 3 refills | Status: DC

## 2021-03-23 NOTE — Addendum Note (Signed)
Addended by: Angus Seller on: 03/23/2021 02:32 PM   Modules accepted: Orders

## 2021-03-23 NOTE — Telephone Encounter (Signed)
10 mg discontinued and 5 mg sent in.

## 2021-03-23 NOTE — Telephone Encounter (Signed)
Martha Cabrera [phone]  Laurieanne called to say the new prescription was for the wrong dose, she had been taking the 5 mg not the 10 mg. She said you guys did not talk about her increasing dosage.

## 2021-05-01 ENCOUNTER — Encounter: Payer: Self-pay | Admitting: Internal Medicine

## 2021-05-01 ENCOUNTER — Ambulatory Visit (INDEPENDENT_AMBULATORY_CARE_PROVIDER_SITE_OTHER): Payer: No Typology Code available for payment source | Admitting: Internal Medicine

## 2021-05-01 ENCOUNTER — Other Ambulatory Visit: Payer: Self-pay

## 2021-05-01 VITALS — BP 98/60 | HR 72 | Temp 97.5°F | Ht 68.5 in | Wt 139.5 lb

## 2021-05-01 DIAGNOSIS — K5900 Constipation, unspecified: Secondary | ICD-10-CM

## 2021-05-01 DIAGNOSIS — Z8639 Personal history of other endocrine, nutritional and metabolic disease: Secondary | ICD-10-CM | POA: Diagnosis not present

## 2021-05-01 DIAGNOSIS — F439 Reaction to severe stress, unspecified: Secondary | ICD-10-CM

## 2021-05-01 MED ORDER — VITAMIN D (ERGOCALCIFEROL) 1.25 MG (50000 UNIT) PO CAPS: 50000.0000 [IU] | ORAL_CAPSULE | ORAL | 0 refills | Status: DC

## 2021-05-01 NOTE — Progress Notes (Signed)
? ?  Subjective:  ? ? Patient ID: Martha Cabrera, female    DOB: [birth year], 61 y.o.   MRN: [id number] ? ?HPI 61 year old Female seen with change in bowel habits that started after stressful family situation around Christmas 2022. This was discussed today. After that, instead of being very regular with one good BM daily, stool caliber decreased and became softer.There may be more than one BM per day. No blood in BMs or melena. Also, has a son in private boarding school in [place] that is beginning to look at colleges. Her job recently has been stressful and less satisfying. She and her husband are getting along well at this time. Other son is being evaluated by Dr. Kinnie Scales for elevated liver functions. ? ? ? ?Review of Systems Patient had colonoscopy 2014 by Dr. Juanda Chance which was normal and at that time 10 year follow up recommended. ? ?   ?Objective:  ? Physical Exam ? Abdomen is soft, non-distended without hepatosplenomegaly, masses or tenderness. ? ?Labs from November 2022 reviewed. ? ?   ?Assessment & Plan:  ?Obstipation ?Situational stressors ?Vitamin D deficiency history- pt requests refill on weekly high dose Vitamin D ? ?Plan: I think she would benefit from a fiber supplement. The current issue seems to be obstipation and decreased caliber of stool with issue with prompt evacuation which is different for her. We will try fiber supplement for 2-3 weeks and if no improvement will then suggest GI consultation. ? ?Total of 30 minutes spent with interviewing patient, PE, chart review, counseling, and medical decision making ? ?

## 2021-05-01 NOTE — Patient Instructions (Addendum)
Recommend fiber supplement to see if consistency and frequency of stools improves. If not, may need to see Gastroenterologist. Situational stress discussed at length. High dose Vitamin D refilled at patient request. ?

## 2021-05-09 ENCOUNTER — Other Ambulatory Visit: Payer: Self-pay | Admitting: Internal Medicine

## 2021-06-09 IMAGING — MG DIGITAL SCREENING BREAST BILAT IMPLANT W/ TOMO W/ CAD
9 of 12 series · 9 of 28 positions shown · non-contrast
Comparison: Previous exams.

CLINICAL DATA: Screening.

EXAM:
DIGITAL SCREENING BILATERAL MAMMOGRAM WITH IMPLANTS, CAD AND
TOMOSYNTHESIS
TECHNIQUE: Bilateral screening digital craniocaudal and mediolateral oblique
mammograms were obtained. Bilateral screening digital breast
tomosynthesis was performed. The images were evaluated with
computer-aided detection. Standard and/or implant displaced views
were performed.

[R CC]
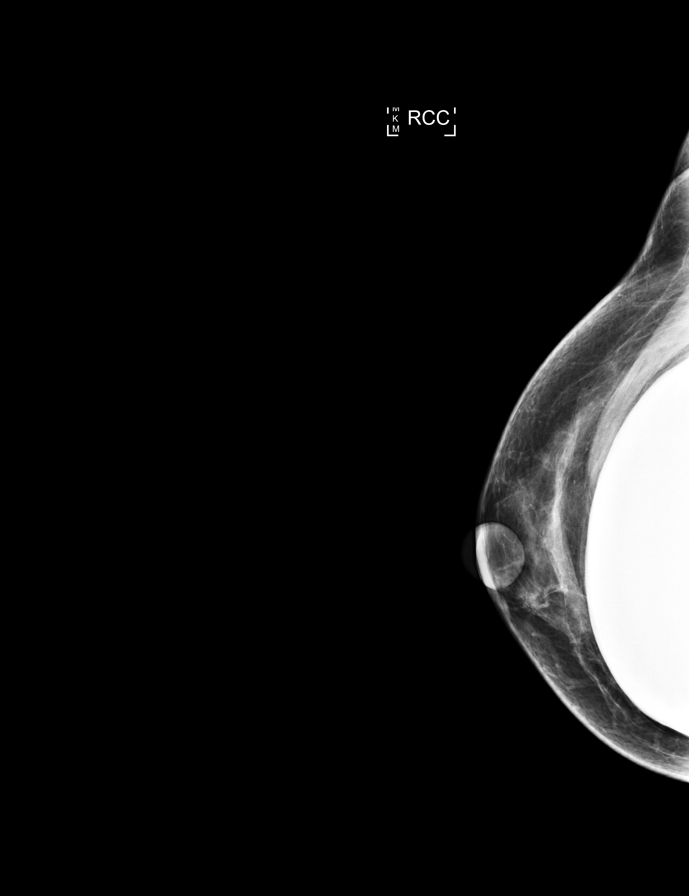

[L CC]
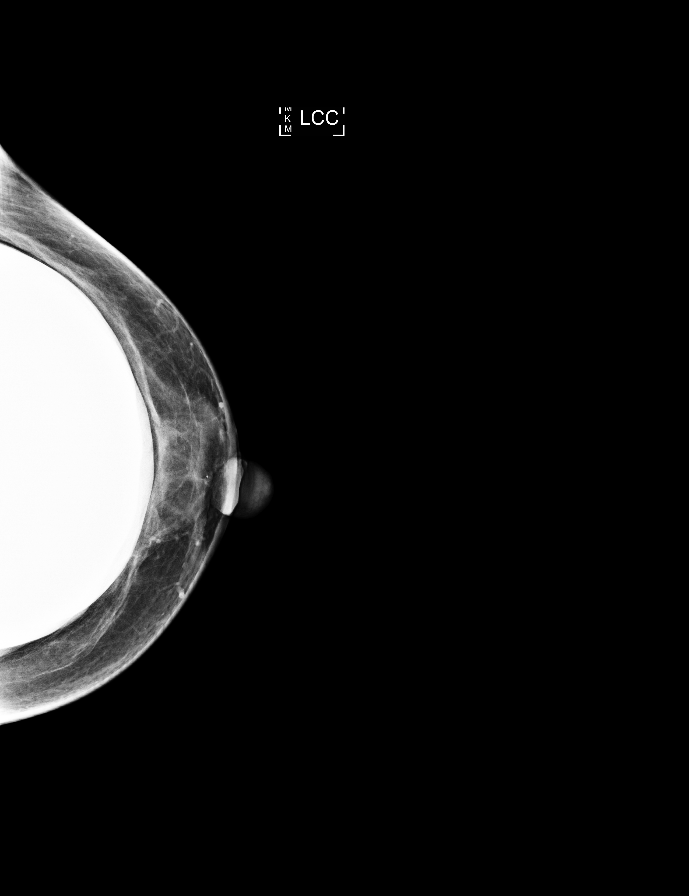

[R MLO]
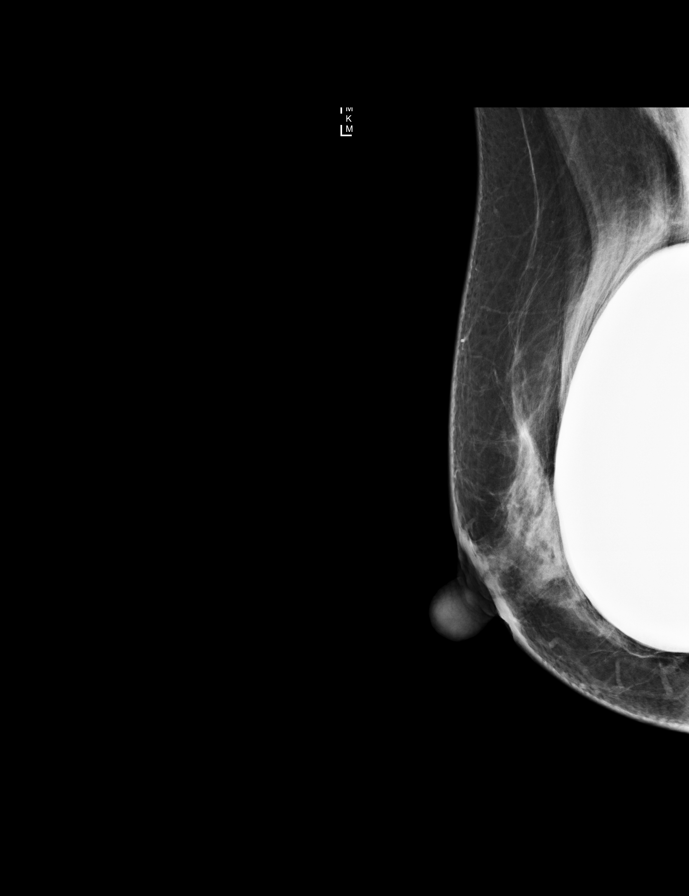

[L MLO]
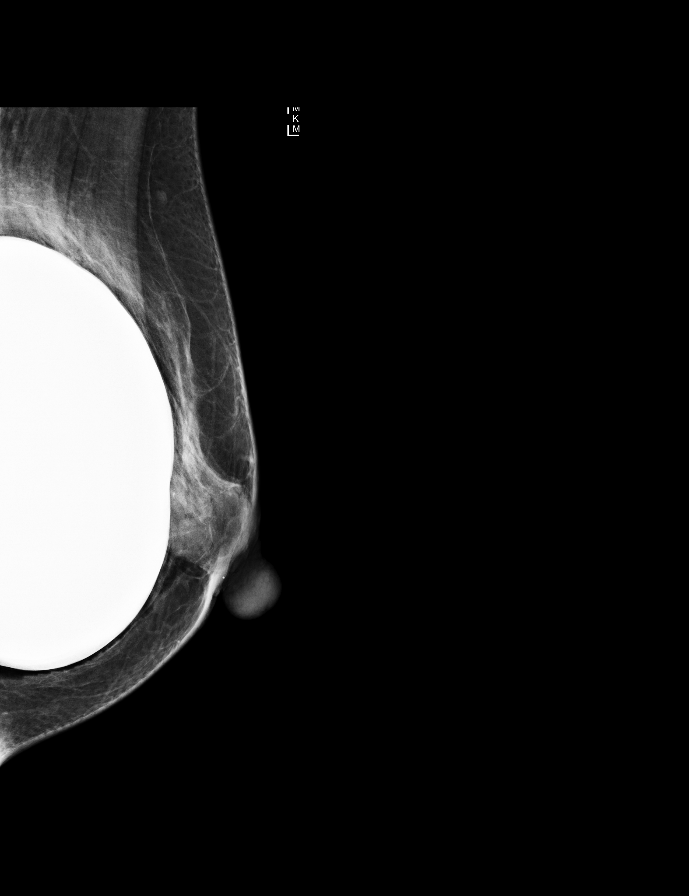

[L CC synth-2D]
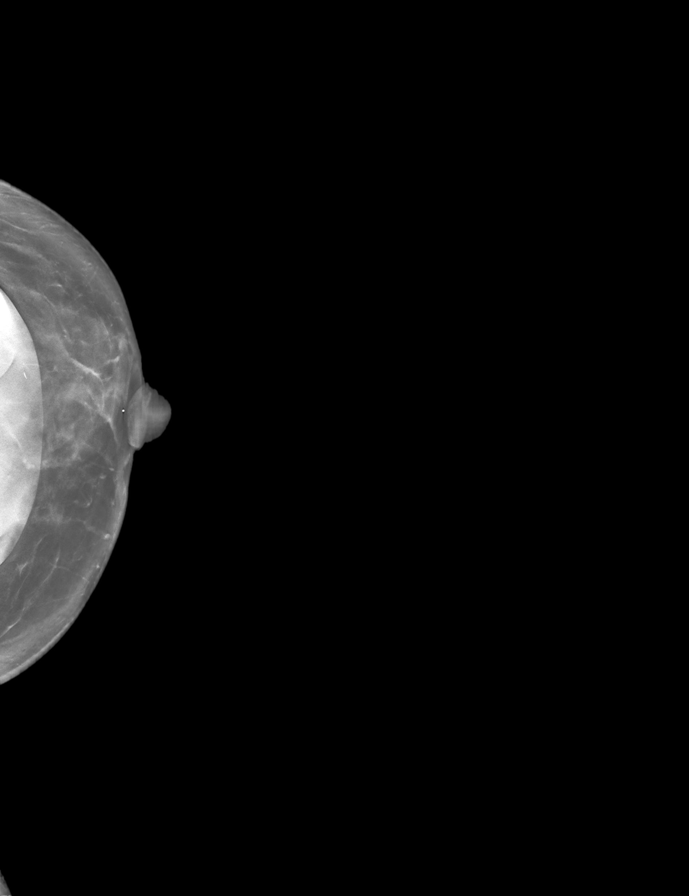

[L MLO synth-2D]
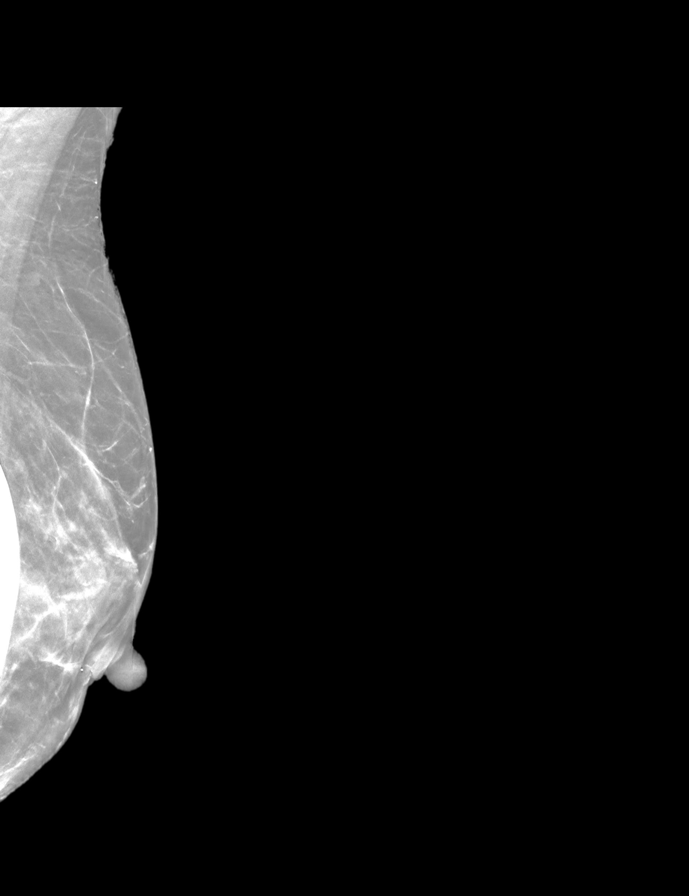

[R MLO synth-2D]
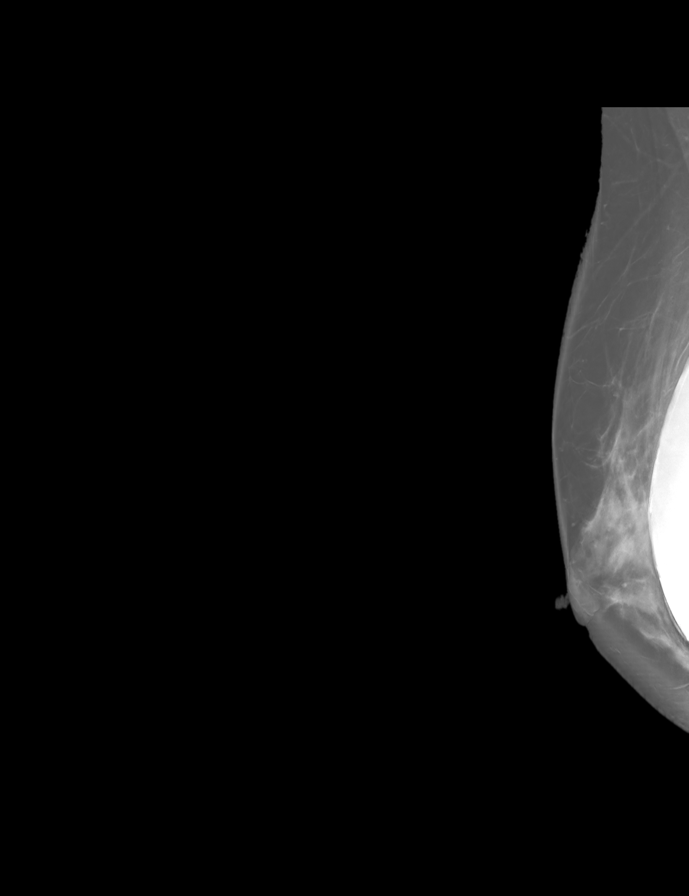

[R CC synth-2D]
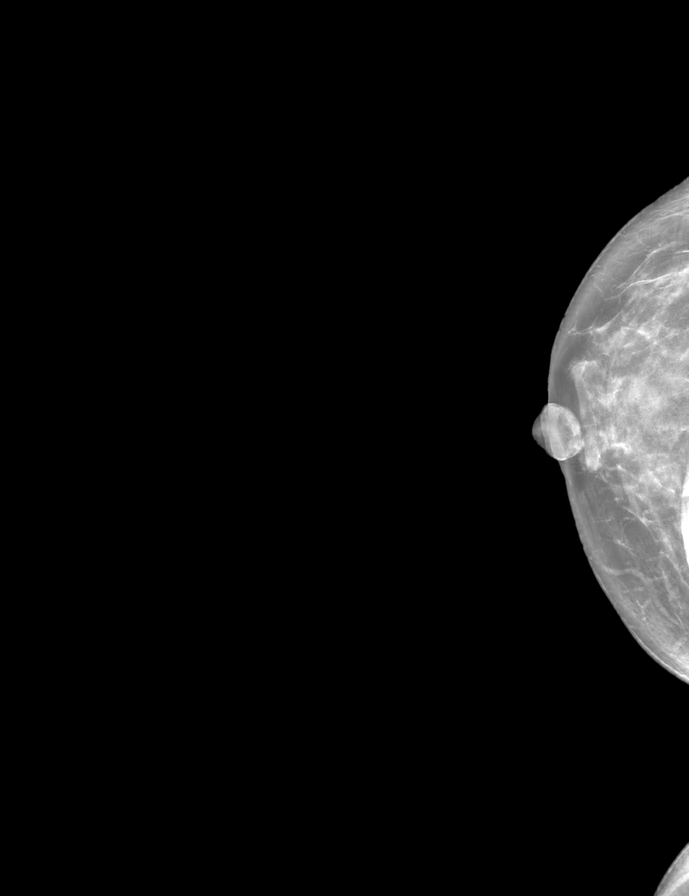

[L CCID BREAST TOMOSYNTHESIS IMAGE tomo · tomo slice 31/61.0]
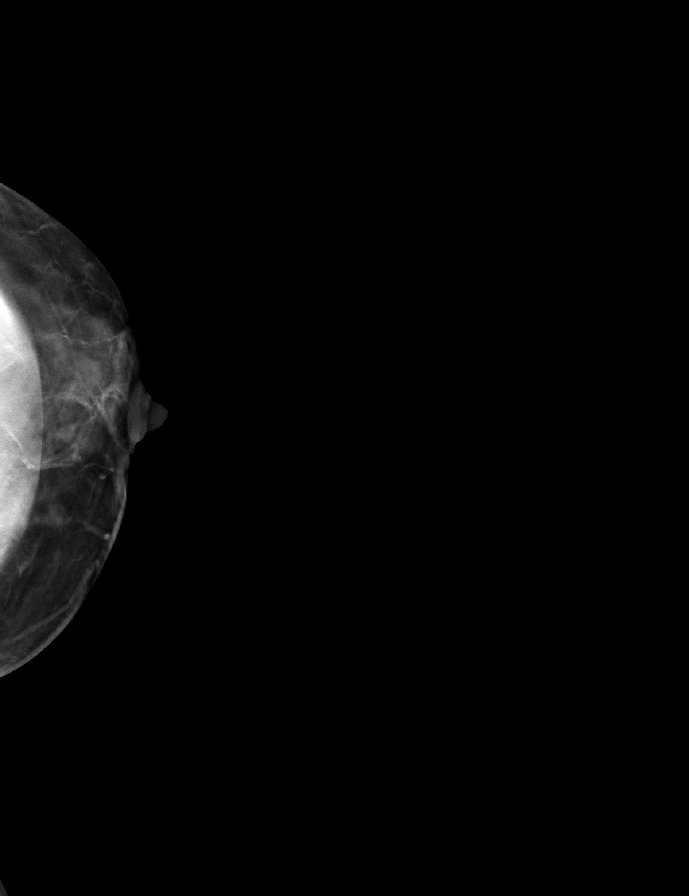

[9 of 28 positions shown; findings below may reference images not displayed]

ACR Breast Density Category c: The breast tissue is heterogeneously
dense, which may obscure small masses.
FINDINGS: The patient has bilateral retropectoral silicone implants. There are
no findings suspicious for malignancy.
IMPRESSION: No mammographic evidence of malignancy. A result letter of this
screening mammogram will be mailed directly to the patient.

RECOMMENDATION:
Screening mammogram in one year. (Code:5K-4-9YF)

BI-RADS CATEGORY  1:  Negative.

## 2021-06-21 ENCOUNTER — Other Ambulatory Visit: Payer: Self-pay | Admitting: Internal Medicine

## 2021-06-21 DIAGNOSIS — Z1231 Encounter for screening mammogram for malignant neoplasm of breast: Secondary | ICD-10-CM

## 2021-07-21 ENCOUNTER — Ambulatory Visit: Payer: No Typology Code available for payment source

## 2021-07-24 ENCOUNTER — Ambulatory Visit
Admission: RE | Admit: 2021-07-24 | Discharge: 2021-07-24 | Disposition: A | Discharge status: Home / Self Care | Payer: No Typology Code available for payment source | Source: Ambulatory Visit | Attending: Internal Medicine | Admitting: Internal Medicine

## 2021-07-24 DIAGNOSIS — Z1231 Encounter for screening mammogram for malignant neoplasm of breast: Secondary | ICD-10-CM

## 2021-07-27 ENCOUNTER — Other Ambulatory Visit: Payer: Self-pay | Admitting: Internal Medicine

## 2021-07-27 ENCOUNTER — Other Ambulatory Visit: Payer: No Typology Code available for payment source

## 2021-07-27 DIAGNOSIS — R928 Other abnormal and inconclusive findings on diagnostic imaging of breast: Secondary | ICD-10-CM

## 2021-07-27 DIAGNOSIS — E559 Vitamin D deficiency, unspecified: Secondary | ICD-10-CM

## 2021-07-27 DIAGNOSIS — E7800 Pure hypercholesterolemia, unspecified: Secondary | ICD-10-CM

## 2021-07-27 DIAGNOSIS — R5383 Other fatigue: Secondary | ICD-10-CM

## 2021-07-27 DIAGNOSIS — F439 Reaction to severe stress, unspecified: Secondary | ICD-10-CM

## 2021-07-27 LAB — CBC WITH DIFFERENTIAL/PLATELET
Absolute Monocytes: 394 cells/uL (ref 200–950)
Basophils Absolute: 49 cells/uL (ref 0–200)
Basophils Relative: 0.9 %
Eosinophils Absolute: 173 cells/uL (ref 15–500)
Eosinophils Relative: 3.2 %
HCT: 42.8 % (ref 35.0–45.0)
Hemoglobin: 14.1 g/dL (ref 11.7–15.5)
Lymphs Abs: 1598 cells/uL (ref 850–3900)
MCH: 30 pg (ref 27.0–33.0)
MCHC: 32.9 g/dL (ref 32.0–36.0)
MCV: 91.1 fL (ref 80.0–100.0)
MPV: 11 fL (ref 7.5–12.5)
Monocytes Relative: 7.3 %
Neutro Abs: 3186 cells/uL (ref 1500–7800)
Neutrophils Relative %: 59 %
Platelets: 244 10*3/uL (ref 140–400)
RBC: 4.7 10*6/uL (ref 3.80–5.10)
RDW: 12.3 % (ref 11.0–15.0)
Total Lymphocyte: 29.6 %
WBC: 5.4 10*3/uL (ref 3.8–10.8)

## 2021-07-27 LAB — COMPLETE METABOLIC PANEL WITH GFR
AG Ratio: 1.7 (calc) (ref 1.0–2.5)
ALT: 26 U/L (ref 6–29)
AST: 28 U/L (ref 10–35)
Albumin: 4.3 g/dL (ref 3.6–5.1)
Alkaline phosphatase (APISO): 71 U/L (ref 37–153)
BUN: 17 mg/dL (ref 7–25)
CO2: 31 mmol/L (ref 20–32)
Calcium: 9.7 mg/dL (ref 8.6–10.4)
Chloride: 104 mmol/L (ref 98–110)
Creat: 0.73 mg/dL (ref 0.50–1.05)
Globulin: 2.6 g/dL (calc) (ref 1.9–3.7)
Glucose, Bld: 87 mg/dL (ref 65–99)
Potassium: 5.2 mmol/L (ref 3.5–5.3)
Sodium: 141 mmol/L (ref 135–146)
Total Bilirubin: 0.5 mg/dL (ref 0.2–1.2)
Total Protein: 6.9 g/dL (ref 6.1–8.1)
eGFR: 94 mL/min/{1.73_m2} (ref >=60)

## 2021-07-27 LAB — TSH: TSH: 2.42 mIU/L (ref 0.40–4.50)

## 2021-07-27 LAB — LIPID PANEL
Cholesterol: 164 mg/dL (ref <200)
HDL: 82 mg/dL (ref >=50)
LDL Cholesterol (Calc): 71 mg/dL (calc)
Non-HDL Cholesterol (Calc): 82 mg/dL (calc) (ref <130)
Total CHOL/HDL Ratio: 2 (calc) (ref <5.0)
Triglycerides: 40 mg/dL (ref <150)

## 2021-07-27 LAB — VITAMIN D 25 HYDROXY (VIT D DEFICIENCY, FRACTURES): Vit D, 25-Hydroxy: 117 ng/mL — ABNORMAL HIGH (ref 30–100)

## 2021-07-31 ENCOUNTER — Ambulatory Visit (INDEPENDENT_AMBULATORY_CARE_PROVIDER_SITE_OTHER): Payer: No Typology Code available for payment source | Admitting: Internal Medicine

## 2021-07-31 ENCOUNTER — Encounter: Payer: Self-pay | Admitting: Internal Medicine

## 2021-07-31 ENCOUNTER — Other Ambulatory Visit: Payer: Self-pay

## 2021-07-31 VITALS — HR 70 | Temp 98.4°F | Resp 18 | Ht 68.5 in | Wt 139.8 lb

## 2021-07-31 DIAGNOSIS — R7989 Other specified abnormal findings of blood chemistry: Secondary | ICD-10-CM

## 2021-07-31 DIAGNOSIS — Z8669 Personal history of other diseases of the nervous system and sense organs: Secondary | ICD-10-CM

## 2021-07-31 DIAGNOSIS — Z23 Encounter for immunization: Secondary | ICD-10-CM | POA: Diagnosis not present

## 2021-07-31 DIAGNOSIS — Z780 Asymptomatic menopausal state: Secondary | ICD-10-CM

## 2021-07-31 DIAGNOSIS — Z0000 Encounter for general adult medical examination without abnormal findings: Secondary | ICD-10-CM | POA: Diagnosis not present

## 2021-07-31 DIAGNOSIS — Z8639 Personal history of other endocrine, nutritional and metabolic disease: Secondary | ICD-10-CM | POA: Diagnosis not present

## 2021-07-31 DIAGNOSIS — K5900 Constipation, unspecified: Secondary | ICD-10-CM

## 2021-07-31 DIAGNOSIS — E7800 Pure hypercholesterolemia, unspecified: Secondary | ICD-10-CM

## 2021-07-31 LAB — POCT URINALYSIS DIP (MANUAL ENTRY)
Bilirubin, UA: NEGATIVE
Blood, UA: NEGATIVE
Glucose, UA: NEGATIVE mg/dL
Ketones, POC UA: NEGATIVE mg/dL
Nitrite, UA: NEGATIVE
Protein Ur, POC: NEGATIVE mg/dL
Spec Grav, UA: 1.015 (ref 1.010–1.025)
Urobilinogen, UA: 0.2 E.U./dL
pH, UA: 6 (ref 5.0–8.0)

## 2021-07-31 NOTE — Patient Instructions (Addendum)
May take OTC Vitamin D 5000 units daily and discontinue high dose weekly Vitamin D supplement as your level on weekly high dose Vitamin D is too high and Vitamin D is a fat soluble vitamin.  Continue Crestor 5 mg daily.     If you have lab work done today you will be contacted with your lab results within the next 2 weeks.  If you have not heard from [place] then please contact us. The fastest way to get your results is to register for My Chart.   IF you received an x-ray today, you will receive an invoice from [facility]. Please contact [facility] at [phone] with questions or concerns regarding your invoice.   IF you received labwork today, you will receive an invoice from  [facility]. Please contact [facility] at  with questions or concerns regarding your invoice.   Our billing staff will not be able to assist you with questions regarding bills from these companies.  You will be contacted with the lab results as soon as they are available. The fastest way to get your results is to activate your My Chart account. Instructions are located on the last page of this paperwork. If you have not heard from [place] regarding the results in 2 weeks, please contact this office.

## 2021-07-31 NOTE — Progress Notes (Signed)
   Subjective:    Patient ID: Martha Cabrera, female    DOB: [birth year], 60 y.o.   MRN: [id number]  HPI  61 year old Female seen for health maintenance exam and evaluation of medical issues. Has to return for diagnostic mammogram. Colonoscopy 2024.   SHx: Works for [employer] for [employer]. Has 2 sons. Married.  Fx: Son with history of cystic fibrosis but doing much better.  Reminded about Shingrix vaccine.  Lipids are stable and within normal limits.  She will take Crestor 5 mg weekly.  Vitamin D level is high at 117. Suggest stopping high dose Vitamin D. May take OTC Vitamin D instead.  Recommend 5000 units vitamin D daily and discontinue high-dose vitamin D weekly.  Review of Systems Semi-solid stools not always responding to fibercon. This started after a stressful Christmas 2022.     Objective:   Physical Exam  VS reviewed.  Skin warm and dry.  Nodes none.  TMs clear.  Neck supple without thyromegaly.  No carotid bruits.  Chest clear.  Cardiac exam: Regular rate and rhythm without ectopy.  Abdomen soft nondistended without hepatosplenomegaly masses or tenderness.  No lower extremity pitting edema.  Neuro is intact without gross focal deficits.       Assessment & Plan:  Pure hypercholesterolemia-continue low-dose Crestor 5 mg daily  Elevated vitamin D level-stop 50,000 units weekly for an entire month.  Then recommending 5000 units daily over-the-counter.  Obstipation treated with MiraLAX  History of migraine headaches treated with Imitrex  Pneumococcal 20 vaccine given.  Plan: Take 5000 units vitamin D daily after stopping 50,000 units weekly for an entire month.  Continue low-dose Crestor 5 mg daily.

## 2021-08-02 ENCOUNTER — Ambulatory Visit: Payer: No Typology Code available for payment source

## 2021-08-02 ENCOUNTER — Ambulatory Visit
Admission: RE | Admit: 2021-08-02 | Discharge: 2021-08-02 | Disposition: A | Discharge status: Home / Self Care | Payer: No Typology Code available for payment source | Source: Ambulatory Visit | Attending: Internal Medicine | Admitting: Internal Medicine

## 2021-08-02 DIAGNOSIS — R928 Other abnormal and inconclusive findings on diagnostic imaging of breast: Secondary | ICD-10-CM

## 2021-08-11 ENCOUNTER — Other Ambulatory Visit: Payer: Self-pay | Admitting: Internal Medicine

## 2021-08-11 DIAGNOSIS — Z780 Asymptomatic menopausal state: Secondary | ICD-10-CM

## 2021-08-17 ENCOUNTER — Telehealth: Payer: Self-pay | Admitting: Internal Medicine

## 2021-08-17 NOTE — Telephone Encounter (Signed)
Martha Cabrera [phone]  Shante called asking about lower dose of Vit. D, she said you wanted her on a lower dose, but she could not remember if it was going to be a prescription or she was to pick it up at the pharmacy, and if she was to pick it up how much should she be taking now?

## 2021-08-17 NOTE — Telephone Encounter (Signed)
Called patient to let her know what Dr Lenord Fellers said and she verbalized understanding and will call back to schedule Vit D lab if she decides she wants it.

## 2021-09-05 ENCOUNTER — Ambulatory Visit
Admission: RE | Admit: 2021-09-05 | Discharge: 2021-09-05 | Disposition: A | Discharge status: Home / Self Care | Payer: No Typology Code available for payment source | Source: Ambulatory Visit | Attending: Internal Medicine | Admitting: Internal Medicine

## 2021-09-14 ENCOUNTER — Ambulatory Visit (INDEPENDENT_AMBULATORY_CARE_PROVIDER_SITE_OTHER): Payer: No Typology Code available for payment source | Admitting: Internal Medicine

## 2021-09-14 ENCOUNTER — Encounter: Payer: Self-pay | Admitting: Internal Medicine

## 2021-09-14 VITALS — BP 102/70 | HR 65

## 2021-09-14 DIAGNOSIS — E7800 Pure hypercholesterolemia, unspecified: Secondary | ICD-10-CM

## 2021-09-14 DIAGNOSIS — R7989 Other specified abnormal findings of blood chemistry: Secondary | ICD-10-CM

## 2021-09-14 DIAGNOSIS — Z780 Asymptomatic menopausal state: Secondary | ICD-10-CM | POA: Diagnosis not present

## 2021-09-14 DIAGNOSIS — M8580 Other specified disorders of bone density and structure, unspecified site: Secondary | ICD-10-CM | POA: Diagnosis not present

## 2021-09-14 NOTE — Progress Notes (Signed)
   Subjective:    Patient ID: Martha Cabrera, female    DOB: [birth year], 61 y.o.   MRN: [id number]  HPI 61 year old Female seen for discussion of results of recent DEXA scan on July 18.  She is postmenopausal.  She had T score -2.1 in the LS spine, T score of -1.5 in the left femoral neck, T score of -1.8 in the left hip, left forearm radius T score -1.2.  All in all has osteopenia.  She is a thin white female and is at risk for osteoporosis but explained to her that an osteoporosis score is -2.5 or below.  We recently noted she had over replacement of vitamin D and have asked her to take 5000 units daily over-the-counter instead of high-dose vitamin D weekly.  She is on Crestor 5 mg daily and Imitrex 100 mg daily.  Options for treatment osteopenia are Boniva monthly, Fosamax monthly, or Prolia.  Prolia is hard to get approved unless patient has osteoporosis.    Review of Systems see above     Objective:   Physical Exam Blood pressure excellent at 102/70 pulse 65 pulse oximetry 97%  Discussion regarding osteopenia, what that means in terms of T-scores and review of her bone density study as well as treatment options.       Assessment & Plan:  High vitamin D level on weekly vitamin D.  Now on 5000 units daily of vitamin D.  In the remote past had vitamin D deficiency and that is why she is on high-dose vitamin D.  Osteopenia-options for treatment discussed  Postmenopausal  Plan: We are happy to send her to endocrinology for consultation regarding treatment but options have been discussed.  She will take over-the-counter vitamin D 5000 units daily instead of high dose.

## 2021-09-17 NOTE — Patient Instructions (Signed)
Would like to continue with over-the-counter vitamin D 5000 units daily since her vitamin D on high-dose vitamin D weekly was too high.  She has osteopenia.  Options for treatment are Boniva and Fosamax, less likely Prolia would be approved by [employer].  We are happy to send her to endocrinology for consultation regarding treatment.

## 2021-11-07 ENCOUNTER — Telehealth: Payer: Self-pay | Admitting: Internal Medicine

## 2021-11-07 ENCOUNTER — Ambulatory Visit (INDEPENDENT_AMBULATORY_CARE_PROVIDER_SITE_OTHER): Payer: No Typology Code available for payment source | Admitting: Internal Medicine

## 2021-11-07 ENCOUNTER — Encounter: Payer: Self-pay | Admitting: Internal Medicine

## 2021-11-07 VITALS — BP 100/64 | HR 63 | Temp 97.7°F | Ht 68.5 in | Wt 143.1 lb

## 2021-11-07 DIAGNOSIS — N949 Unspecified condition associated with female genital organs and menstrual cycle: Secondary | ICD-10-CM | POA: Diagnosis not present

## 2021-11-07 DIAGNOSIS — N390 Urinary tract infection, site not specified: Secondary | ICD-10-CM | POA: Diagnosis not present

## 2021-11-07 DIAGNOSIS — R8281 Pyuria: Secondary | ICD-10-CM

## 2021-11-07 LAB — POCT URINALYSIS DIPSTICK
Bilirubin, UA: NEGATIVE
Blood, UA: POSITIVE
Glucose, UA: NEGATIVE
Ketones, UA: NEGATIVE
Nitrite, UA: NEGATIVE
Protein, UA: NEGATIVE
Spec Grav, UA: 1.015 (ref 1.010–1.025)
Urobilinogen, UA: 0.2 E.U./dL
pH, UA: 5 (ref 5.0–8.0)

## 2021-11-07 MED ORDER — CIPROFLOXACIN HCL 500 MG PO TABS: 500.0000 mg | ORAL_TABLET | Freq: Two times a day (BID) | ORAL | 0 refills | Status: AC

## 2021-11-07 NOTE — Progress Notes (Unsigned)
   Subjective:    Patient ID: Martha Cabrera, female    DOB: Feb 05, 1961, 61 y.o.   MRN: [id number]  HPI    Review of Systems     Objective:   Physical Exam        Assessment & Plan:

## 2021-11-07 NOTE — Telephone Encounter (Signed)
Chrisoula Zegarra [phone]  Nona called to say she has burning and uncomfortable feeling, so I scheduled her for UTI check this afternoon at 4:15

## 2021-11-08 NOTE — Patient Instructions (Signed)
You have been diagnosed with a urinary tract infection.  Urine culture has been sent.  Please take Cipro 500 mg twice daily for 10 days.  It was a pleasure to see you today.  We are sorry you are not feeling well.  Depending on culture results, you may need to return for repeat urine specimen and nurse visit.

## 2021-11-10 LAB — URINE CULTURE
MICRO NUMBER:: 13937897
SPECIMEN QUALITY:: ADEQUATE

## 2021-11-15 ENCOUNTER — Telehealth: Payer: Self-pay | Admitting: Internal Medicine

## 2021-11-15 ENCOUNTER — Encounter (HOSPITAL_BASED_OUTPATIENT_CLINIC_OR_DEPARTMENT_OTHER): Payer: Self-pay

## 2021-11-15 ENCOUNTER — Other Ambulatory Visit: Payer: Self-pay

## 2021-11-15 ENCOUNTER — Emergency Department (HOSPITAL_BASED_OUTPATIENT_CLINIC_OR_DEPARTMENT_OTHER): Payer: No Typology Code available for payment source | Admitting: Radiology

## 2021-11-15 ENCOUNTER — Emergency Department (HOSPITAL_BASED_OUTPATIENT_CLINIC_OR_DEPARTMENT_OTHER)
Admission: EM | Admit: 2021-11-15 | Discharge: 2021-11-15 | Disposition: A | Discharge status: Home / Self Care | Payer: No Typology Code available for payment source | Attending: Emergency Medicine | Admitting: Emergency Medicine

## 2021-11-15 DIAGNOSIS — R0602 Shortness of breath: Secondary | ICD-10-CM | POA: Diagnosis not present

## 2021-11-15 DIAGNOSIS — R001 Bradycardia, unspecified: Secondary | ICD-10-CM | POA: Diagnosis not present

## 2021-11-15 DIAGNOSIS — Z859 Personal history of malignant neoplasm, unspecified: Secondary | ICD-10-CM | POA: Insufficient documentation

## 2021-11-15 DIAGNOSIS — R002 Palpitations: Secondary | ICD-10-CM | POA: Insufficient documentation

## 2021-11-15 LAB — BASIC METABOLIC PANEL
Anion gap: 10 (ref 5–15)
BUN: 20 mg/dL (ref 6–20)
CO2: 29 mmol/L (ref 22–32)
Calcium: 9.6 mg/dL (ref 8.9–10.3)
Chloride: 101 mmol/L (ref 98–111)
Creatinine, Ser: 0.68 mg/dL (ref 0.44–1.00)
GFR, Estimated: >60 mL/min (ref >60)
Glucose, Bld: 111 mg/dL — ABNORMAL HIGH (ref 70–99)
Potassium: 4.1 mmol/L (ref 3.5–5.1)
Sodium: 140 mmol/L (ref 135–145)

## 2021-11-15 LAB — CBC
HCT: 43.1 % (ref 36.0–46.0)
Hemoglobin: 14.1 g/dL (ref 12.0–15.0)
MCH: 29.7 pg (ref 26.0–34.0)
MCHC: 32.7 g/dL (ref 30.0–36.0)
MCV: 90.9 fL (ref 80.0–100.0)
Platelets: 257 10*3/uL (ref 150–400)
RBC: 4.74 MIL/uL (ref 3.87–5.11)
RDW: 13.2 % (ref 11.5–15.5)
WBC: 8.9 10*3/uL (ref 4.0–10.5)
nRBC: 0 % (ref 0.0–0.2)

## 2021-11-15 LAB — TROPONIN I (HIGH SENSITIVITY)
Troponin I (High Sensitivity): <2 ng/L (ref <18)
Troponin I (High Sensitivity): <2 ng/L (ref <18)

## 2021-11-15 LAB — TSH: TSH: 1.486 u[IU]/mL (ref 0.350–4.500)

## 2021-11-15 LAB — MAGNESIUM: Magnesium: 2 mg/dL (ref 1.7–2.4)

## 2021-11-15 NOTE — Discharge Instructions (Signed)
You are seen in the ER for palpitations.  Your blood work here is reassuring.  Our telemetry monitoring did not pick up any concerning findings.  Given the increased frequency of your episodes and longer lasting duration, we do think you will benefit with cardiac monitoring.  Attached instruction has some information on the type of device we are talking about.  We recommend that you call your PCP to see if they can order an event monitor for you.  If they are unsuccessful, then you will need to follow-up with our cardiology team to get that arranged.  Return to the ER if your symptoms get worse or you start having severe symptoms such as chest pain, shortness of breath, fainting or near fainting.

## 2021-11-15 NOTE — ED Provider Notes (Signed)
[facility] EMERGENCY DEPT Provider Note   CSN: [id number] Arrival date & time: 11/15/21  1601     History  Chief Complaint  Patient presents with   Shortness of Breath    Martha Cabrera is a 61 y.o. female.  HPI     61 year old female comes in with chief complaint of shortness of breath. Patient has no significant past medical history and is quite fit.  She states that she has had episodes of palpitations off and on over the course of several months or years.  More recently however she has been having more frequent episodes.  Over the last 3 days she has had daily occurrence lasting from 30 to 45 minutes, prompting her to reach out to her PCP.  She also checked her pulse and noted that there were extra beats or skipped beats.  There is concerned that she might have new onset A-fib and she was advised to come to the ER by the PCP.  Patient does have a history of mitral valve prolapse. Pt has no hx of PE, DVT and denies any exogenous hormone (testosterone / estrogen) use, long distance travels or surgery in the past 6 weeks, active cancer, recent immobilization.  Patient denies any stimulant use.  Home Medications Prior to Admission medications   Medication Sig Start Date End Date Taking? Authorizing Provider  ciprofloxacin (CIPRO) 500 MG tablet Take 1 tablet (500 mg total) by mouth 2 (two) times daily for 10 days. 11/07/21 11/17/21  Elby Showers, MD  rosuvastatin (CRESTOR) 5 MG tablet Take 1 tablet (5 mg total) by mouth daily. 03/23/21   Elby Showers, MD  SUMAtriptan (IMITREX) 100 MG tablet Take 1 tablet (100 mg total) by mouth as needed. 12/08/18   Elby Showers, MD  Vitamin D, Ergocalciferol, (DRISDOL) 1.25 MG (50000 UNIT) CAPS capsule TAKE 1 CAPSULE BY MOUTH EVERY 7 DAYS 05/09/21   Elby Showers, MD      Allergies    Patient has no known allergies.    Review of Systems   Review of Systems  All other systems reviewed and are negative.   Physical  Exam Updated Vital Signs BP 127/69   Pulse 71   Temp 98 F (36.7 C) (Oral)   Resp 15   Ht 5' 8.5" (1.74 m)   Wt 64.9 kg   LMP 06/04/2015   SpO2 98%   BMI 21.44 kg/m  Physical Exam Vitals and nursing note reviewed.  Constitutional:      Appearance: She is well-developed.  HENT:     Head: Atraumatic.  Cardiovascular:     Rate and Rhythm: Normal rate.  Pulmonary:     Effort: Pulmonary effort is normal.     Breath sounds: No decreased breath sounds, wheezing, rhonchi or rales.  Musculoskeletal:     Cervical back: Normal range of motion and neck supple.  Skin:    General: Skin is warm and dry.  Neurological:     Mental Status: She is alert and oriented to person, place, and time.     ED Results / Procedures / Treatments   Labs (all labs ordered are listed, but only abnormal results are displayed) Labs Reviewed  BASIC METABOLIC PANEL - Abnormal; Notable for the following components:      Result Value   Glucose, Bld 111 (*)    All other components within normal limits  CBC  MAGNESIUM  TSH  TROPONIN I (HIGH SENSITIVITY)  TROPONIN I (HIGH SENSITIVITY)  EKG EKG Interpretation  Date/Time:  Wednesday November 15 2021 16:10:50 EDT Ventricular Rate:  56 PR Interval:  184 QRS Duration: 76 QT Interval:  424 QTC Calculation: 409 R Axis:   79 Text Interpretation: Sinus bradycardia Otherwise normal ECG No previous ECGs available No acute changes No old tracing to compare Confirmed by Derwood Kaplan [phone]) on 11/15/2021 7:51:05 PM  Radiology DG Chest 2 View  Result Date: 11/15/2021 CLINICAL DATA:  Shortness of breath EXAM: CHEST - 2 VIEW COMPARISON:  Chest x-ray 05/14/2017 FINDINGS: The heart size and mediastinal contours are within normal limits. Both lungs are clear. The visualized skeletal structures are unremarkable. IMPRESSION: No active cardiopulmonary disease. Electronically Signed   By: Darliss Cheney M.D.   On: 11/15/2021 16:42    Procedures Procedures     Medications Ordered in ED Medications - No data to display  ED Course/ Medical Decision Making/ A&P                           Medical Decision Making Amount and/or Complexity of Data Reviewed Labs: ordered. Radiology: ordered.   This patient presents to the ED with chief complaint(s) of palpitations with pertinent past medical history of mitral valve prolapse.The complaint involves an extensive differential diagnosis and also carries with it a high risk of complications and morbidity.    The differential diagnosis includes atrial fibrillation, PSVT, PVCs, atrial tachycardia.  The initial plan is to get basic labs and EKG.  Independent labs interpretation:  The following labs were independently interpreted: Patient's CBC, BMP, troponin are normal and reassuring.  Independent visualization and interpretation of imaging: - I independently visualized the following imaging with scope of interpretation limited to determining acute life threatening conditions related to emergency care: X-ray of the chest, which revealed no pneumothorax, no cardiomegaly  Treatment and Reassessment: I reviewed patient's telemetry monitoring.  Patient did not have any alarming events during her close to 2 hours monitoring in the ER.  Results discussed with the patient.  In the ER she has been asymptomatic.  She is comfortable with discharge.  I think she will benefit with event monitor.  I have put in a cardiology follow-up request, in case her PCP is unable to obtain Holter monitoring.  Final Clinical Impression(s) / ED Diagnoses Final diagnoses:  Palpitations    Rx / DC Orders ED Discharge Orders          Ordered    Ambulatory referral to Cardiology       Comments: If you have not heard from the Cardiology office within the next 72 hours please call [phone].   11/15/21 2158              Derwood Kaplan, MD 11/15/21 2202

## 2021-11-15 NOTE — ED Notes (Signed)
Reviewed AVS/discharge instruction with patient. Time allotted for and all questions answered. Patient is agreeable for d/c and escorted to ed exit by staff.  

## 2021-11-15 NOTE — Telephone Encounter (Signed)
Minie Road[facility] [phone]  Tywanna called to say she is having spells of being winded, and it makes her cough little bit and she almost feels short of breath and her pulse in her neck does not feel normal, she thinks it may be Afib. These spells are happening more and more often and has been going on for 1-2 weeks.  She does not feel like she needs to go to emergency room, but would like to come and see you to be evaluated.

## 2021-11-15 NOTE — ED Triage Notes (Signed)
Patient here POV from Home.  Endorses "Winded Sensation" for approximately 3 Days. No Discernable CP. Intermittent in [place] but Episodes can be long in Duration.   States she feels as if she has Atrial Fib. But has never been formally diagnosed. Sent by PCP Office for Evaluation to attempt to diagnose Arrhythmia.   NAD Noted during Triage. A&Ox4. GCS 15. Ambulatory.

## 2021-11-17 ENCOUNTER — Encounter: Payer: Self-pay | Admitting: Internal Medicine

## 2021-11-17 ENCOUNTER — Ambulatory Visit (INDEPENDENT_AMBULATORY_CARE_PROVIDER_SITE_OTHER): Payer: No Typology Code available for payment source | Admitting: Internal Medicine

## 2021-11-17 VITALS — BP 100/64 | HR 63 | Temp 98.1°F

## 2021-11-17 DIAGNOSIS — E7800 Pure hypercholesterolemia, unspecified: Secondary | ICD-10-CM | POA: Diagnosis not present

## 2021-11-17 DIAGNOSIS — R002 Palpitations: Secondary | ICD-10-CM | POA: Diagnosis not present

## 2021-11-17 MED ORDER — ALPRAZOLAM 0.25 MG PO TABS: 0.2500 mg | ORAL_TABLET | Freq: Two times a day (BID) | ORAL | 0 refills | Status: DC | PRN

## 2021-11-17 NOTE — Patient Instructions (Signed)
Have prescribed small quantity of Xanax to take if she is anxious with recurrent palpitations.  Please keep appointment with Dr. Harl Bowie this coming Wednesday.  An Apple watch is an option to monitor the rhythm.

## 2021-11-17 NOTE — Progress Notes (Signed)
   Subjective:    Patient ID: Martha Cabrera, female    DOB: 18-Aug-1960, 61 y.o.   MRN: [id number]  HPI 61 year old Female seen urgently today complaining of palpitations that are frightening.  No syncope or SOB. No chest pain.  Patient was walking with some friends on Sunday, September 24 when she first noticed this problem.  She had had a couple of glasses of champagne the evening before.  Does not drink alcohol on a regular basis.  She noticed palpitations without chest pain.  It caught her off guard.  She stopped a minute to rest.  Did not walk all that far.  She called on September 27 with similar symptoms and was directed to the Emergency department.  She had an extensive evaluation.  EKG in the emergency department had a rate of 56 but no other EKGs available to compare.  Chest x-ray was normal.  Electrolytes were normal.  Troponin 1 was negative.  TSH was normal.  Magnesium was normal.  BUN and creatinine were normal.  She has an appointment to be seen at [facility] cardiology with Dr. Harl Bowie this coming Wednesday.  She called today urgently obviously anxious and upset about recurrence of symptoms.  We asked her to come on over today so we can get an EKG.  By the time she got here, she was not having symptoms.  She will be starting a new job with the [employer] but has not started to work yet.  She denies any at home stress for situation going on that would make her anxious or upset.  EKG done in 2019 at [facility] walk-in clinic looks very similar to this EKG today.  She had health maintenance exam here in June.  Social history: Married.  2 sons.  1 son diagnosed recently with cystic fibrosis.  She has been started on Crestor 5 mg weekly in early 2023 as her total cholesterol was 233, HDL 80, triglycerides 55 and LDL 138.  In June her lipid panel was completely normal.  Her TSH was normal in June.  Review of Systems no nausea, vomiting, headache, shortness of breath      Objective:   Physical Exam Blood pressure 100/64 pulse 63 temperature 98.1 degrees pulse oximetry 100% Skin: Warm and dry.  No cervical adenopathy.  No thyromegaly.  No carotid bruits.  Chest clear.  Cardiac exam: Regular rate and rhythm without click, murmur, rub.  No lower extremity edema.      Assessment & Plan:  Intermittent palpitations that are frightening to the patient-?  PACs or PVCs  Plan: She has appointment to see cardiologist this coming Wednesday.  I have prescribed small quantity of Xanax to take if she gets upset with these recurring episodes.  I do not want to put her on a beta-blocker at this point in time since we clearly have not isolated what rhythm she is having.  She is asking about mitral valve prolapse.  A 2D echo would be reasonable.  I will let Cardiology decide about that.  Asked to avoid alcohol in the interim.  An option would be an Apple Watch to monitor her rhythm. JH:ERDEY 0.25 mg tab  one twice daily if needed for palpitaions.

## 2021-11-21 NOTE — Progress Notes (Unsigned)
Cardiology Office Note:    Date:  11/22/2021   ID:  Martha Cabrera, DOB 1960/06/09, MRN [id number]  PCP:  Elby Showers, MD   [place] [facility] Cardiologist:  Janina Mayo, MD     Referring MD: Varney Biles, MD   Chief Complaint  Patient presents with   New Patient (Initial Visit)  Palpitations  History of Present Illness:    Martha Cabrera is a 61 y.o. female with no cardiac hx, referral for palpitations. TSH is normal. Presented to the ED with SOB/palpitations. Trop negative. She noted episodes of 30-45 min lasting 3 days. Noted ?MVP hx. She notes some windedness and an irregular rhythm. She feels well when she exercises. She saw Dr. Geraldo Pitter in 2019 and had a negative holter. She has a normal ETT at that time. She had a cac score done which was zero.   No past medical history on file.  Past Surgical History:  Procedure Laterality Date   AUGMENTATION MAMMAPLASTY  1989   RETRO PECTORAL SALINE   BLADDER SUSPENSION     BREAST ENHANCEMENT SURGERY     1990   BREAST SURGERY  1990   Breast Augmentation    Current Medications: Current Meds  Medication Sig   Cholecalciferol (VITAMIN D) 125 MCG (5000 UT) CAPS Take by mouth.   rosuvastatin (CRESTOR) 5 MG tablet Take 1 tablet (5 mg total) by mouth daily.   SUMAtriptan (IMITREX) 100 MG tablet Take 1 tablet (100 mg total) by mouth as needed.     Allergies:   Patient has no known allergies.   Social History   Socioeconomic History   Marital status: Married    Spouse name: Not on file   Number of children: Not on file   Years of education: Not on file   Highest education level: Not on file  Occupational History   Not on file  Tobacco Use   Smoking status: Former    Years: 2.00    Types: Cigarettes    Quit date: 10/09/1977    Years since quitting: 44.1   Smokeless tobacco: Never  Substance and Sexual Activity   Alcohol use: Yes    Alcohol/week: 1.0 - 2.0 standard drink of alcohol    Types: 1  - 2 Glasses of wine per week    Comment: 1-2 glasses a wk or less   Drug use: No   Sexual activity: Yes    Birth control/protection: I.U.D.  Other Topics Concern   Not on file  Social History Narrative   Not on file   Social Determinants of Health   Financial Resource Strain: Not on file  Food Insecurity: Not on file  Transportation Needs: Not on file  Physical Activity: Not on file  Stress: Not on file  Social Connections: Not on file     Family History: The patient's family history includes ADD / ADHD in her son; Anxiety disorder in her son; Crohn's disease in her father; Depression in her mother and son; Diabetes in her father; Hyperlipidemia in her father; Hypertension in her brother and father. There is no history of Colon cancer, Esophageal cancer, or Stomach cancer.  ROS:   Please see the history of present illness.     All other systems reviewed and are negative.  EKGs/Labs/Other Studies Reviewed:    The following studies were reviewed today:   EKG:  EKG is  ordered today.  The ekg ordered today demonstrates   11/22/2021- NSR  Recent Labs: 07/27/2021:  ALT 26 11/15/2021: BUN 20; Creatinine, Ser 0.68; Hemoglobin 14.1; Magnesium 2.0; Platelets 257; Potassium 4.1; Sodium 140; TSH 1.486   Recent Lipid Panel    Component Value Date/Time   CHOL 164 07/27/2021 0918   CHOL 200 (H) 08/12/2017 0931   TRIG 40 07/27/2021 0918   HDL 82 07/27/2021 0918   HDL 85 08/12/2017 0931   CHOLHDL 2.0 07/27/2021 0918   LDLCALC 71 07/27/2021 0918     Risk Assessment/Calculations:     Physical Exam:    VS:  Vitals:   11/22/21 1108  BP: 122/64  Pulse: 70  SpO2: 98%       BP 122/64 (BP Location: Left Arm, Patient Position: Sitting, Cuff Size: Normal)   Pulse 70   Ht 5\' 8"  (1.727 m)   Wt 142 lb 12.8 oz (64.8 kg)   LMP 06/04/2015   SpO2 98%   BMI 21.71 kg/m     Wt Readings from Last 3 Encounters:  11/22/21 142 lb 12.8 oz (64.8 kg)  11/15/21 143 lb 1.3 oz (64.9 kg)   11/07/21 143 lb 1.9 oz (64.9 kg)     GEN:  Well nourished, well developed in no acute distress HEENT: Normal NECK: No JVD; No carotid bruits LYMPHATICS: No lymphadenopathy CARDIAC: RRR, no murmurs, rubs, gallops RESPIRATORY:  Clear to auscultation without rales, wheezing or rhonchi  ABDOMEN: Soft, non-tender, non-distended MUSCULOSKELETAL:  No edema; No deformity  SKIN: Warm and dry NEUROLOGIC:  Alert and oriented x 3 PSYCHIATRIC:  Normal affect   ASSESSMENT:   Palpitations: normal ECG. No ectopy. Considering  her age, afib is a possibility. Will get a cardiac monitor.  ?MVP: will get an echo PLAN:    In order of problems listed above:  14 day ziopatch TTE           Medication Adjustments/Labs and Tests Ordered: Current medicines are reviewed at length with the patient today.  Concerns regarding medicines are outlined above.  Orders Placed This Encounter  Procedures   LONG TERM MONITOR (3-14 DAYS)   EKG 12-Lead   ECHOCARDIOGRAM COMPLETE   No orders of the defined types were placed in this encounter.   Patient Instructions  Medication Instructions:  No Changes In Medications at this time.  *If you need a refill on your cardiac medications before your next appointment, please call your pharmacy*  Lab Work: None Ordered At This Time.  If you have labs (blood work) drawn today and your tests are completely normal, you will receive your results only by: [place] (if you have MyChart) OR A paper copy in the mail If you have any lab test that is abnormal or we need to change your treatment, we will call you to review the results.  Testing/Procedures: Your physician has requested that you have an echocardiogram. Echocardiography is a painless test that uses sound waves to create images of your heart. It provides your doctor with information about the size and shape of your heart and how well your heart's chambers and valves are working. You may receive an  ultrasound enhancing agent through an IV if needed to better visualize your heart during the echo.This procedure takes approximately one hour. There are no restrictions for this procedure. This will take place at the [address].    ZIO XT- Long Term Monitor Instructions   Your physician has requested you wear your ZIO patch monitor___14____days.   This is a single patch monitor.  [employer] supplies one patch monitor  per enrollment.  Additional stickers are not available.   Please do not apply patch if you will be having a Nuclear Stress Test, Echocardiogram, Cardiac CT, MRI, or Chest Xray during the time frame you would be wearing the monitor. The patch cannot be worn during these tests.  You cannot remove and re-apply the ZIO XT patch monitor.   Your ZIO patch monitor will be sent USPS Priority mail from [facility] directly to your home address. The monitor may also be mailed to a PO BOX if home delivery is not available.   It may take 3-5 days to receive your monitor after you have been enrolled.   Once you have received you monitor, please review enclosed instructions.  Your monitor has already been registered assigning a specific monitor serial # to you.   Applying the monitor   Shave hair from upper left chest.   Hold abrader disc by orange tab.  Rub abrader in 40 strokes over left upper chest as indicated in your monitor instructions.   Clean area with 4 enclosed alcohol pads .  Use all pads to assure are is cleaned thoroughly.  Let dry.   Apply patch as indicated in monitor instructions.  Patch will be place under collarbone on left side of chest with arrow pointing upward.   Rub patch adhesive wings for 2 minutes.Remove white label marked "1".  Remove white label marked "2".  Rub patch adhesive wings for 2 additional minutes.   While looking in a mirror, press and release button in center of patch.  A small green light will flash 3-4 times .  This will be  your only indicator the monitor has been turned on.     Do not shower for the first 24 hours.  You may shower after the first 24 hours.   Press button if you feel a symptom. You will hear a small click.  Record Date, Time and Symptom in the Patient Log Book.   When you are ready to remove patch, follow instructions on last 2 pages of Patient Log Book.  Stick patch monitor onto last page of Patient Log Book.   Place Patient Log Book in [place] box.  Use locking tab on box and tape box closed securely.  The [employer] has [employer] on it.  Please place in mailbox as soon as possible.  Your physician should have your test results approximately 7 days after the monitor has been mailed back to [facility].   Call [place] at [phone] if you have questions regarding your ZIO XT patch monitor.  Call them immediately if you see an orange light blinking on your monitor.   If your monitor falls off in less than 4 days contact our Monitor department at [phone].  If your monitor becomes loose or falls off after 4 days call [employer] at [phone] for suggestions on securing your monitor.   Follow-Up: At [facility], you and your health needs are our priority.  As part of our continuing mission to provide you with exceptional heart care, we have created designated Provider Care Teams.  These Care Teams include your primary Cardiologist (physician) and Advanced Practice Providers (APPs -  Physician Assistants and Nurse Practitioners) who all work together to provide you with the care you need, when you need it.  Your next appointment:   AS NEEDED   The format for your next appointment:   In Person  Provider:   Phineas Inches  E, MD             Signed, Janina Mayo, MD  11/22/2021 11:45 AM    [place]

## 2021-11-22 ENCOUNTER — Encounter: Payer: Self-pay | Admitting: Internal Medicine

## 2021-11-22 ENCOUNTER — Ambulatory Visit: Payer: No Typology Code available for payment source | Attending: Internal Medicine | Admitting: Internal Medicine

## 2021-11-22 ENCOUNTER — Ambulatory Visit: Payer: No Typology Code available for payment source | Attending: Internal Medicine

## 2021-11-22 VITALS — BP 122/64 | HR 70 | Ht 68.0 in | Wt 142.8 lb

## 2021-11-22 DIAGNOSIS — R002 Palpitations: Secondary | ICD-10-CM

## 2021-11-22 NOTE — Patient Instructions (Signed)
Medication Instructions:  No Changes In Medications at this time.   *If you need a refill on your cardiac medications before your next appointment, please call your pharmacy*  Lab Work: None Ordered At This Time.  If you have labs (blood work) drawn today and your tests are completely normal, you will receive your results only by: MyChart Message (if you have MyChart) OR A paper copy in the mail If you have any lab test that is abnormal or we need to change your treatment, we will call you to review the results.  Testing/Procedures: Your physician has requested that you have an echocardiogram. Echocardiography is a painless test that uses sound waves to create images of your heart. It provides your doctor with information about the size and shape of your heart and how well your heart's chambers and valves are working. You may receive an ultrasound enhancing agent through an IV if needed to better visualize your heart during the echo.This procedure takes approximately one hour. There are no restrictions for this procedure. This will take place at the [address].    ZIO XT- Long Term Monitor Instructions   Your physician has requested you wear your ZIO patch monitor___14____days.   This is a single patch monitor.  [employer] supplies one patch monitor per enrollment.  Additional stickers are not available.   Please do not apply patch if you will be having a Nuclear Stress Test, Echocardiogram, Cardiac CT, MRI, or Chest Xray during the time frame you would be wearing the monitor. The patch cannot be worn during these tests.  You cannot remove and re-apply the ZIO XT patch monitor.   Your ZIO patch monitor will be sent USPS Priority mail from [employer] directly to your home address. The monitor may also be mailed to a PO BOX if home delivery is not available.   It may take 3-5 days to receive your monitor after you have been enrolled.   Once you have received you  monitor, please review enclosed instructions.  Your monitor has already been registered assigning a specific monitor serial # to you.   Applying the monitor   Shave hair from upper left chest.   Hold abrader disc by orange tab.  Rub abrader in 40 strokes over left upper chest as indicated in your monitor instructions.   Clean area with 4 enclosed alcohol pads .  Use all pads to assure are is cleaned thoroughly.  Let dry.   Apply patch as indicated in monitor instructions.  Patch will be place under collarbone on left side of chest with arrow pointing upward.   Rub patch adhesive wings for 2 minutes.Remove white label marked "1".  Remove white label marked "2".  Rub patch adhesive wings for 2 additional minutes.   While looking in a mirror, press and release button in center of patch.  A small green light will flash 3-4 times .  This will be your only indicator the monitor has been turned on.     Do not shower for the first 24 hours.  You may shower after the first 24 hours.   Press button if you feel a symptom. You will hear a small click.  Record Date, Time and Symptom in the Patient Log Book.   When you are ready to remove patch, follow instructions on last 2 pages of Patient Log Book.  Stick patch monitor onto last page of Patient Log Book.   Place Patient Log Book in   [place] box.  Use locking tab on box and tape box closed securely.  The [employer] box has prepaid postage on it.  Please place in mailbox as soon as possible.  Your physician should have your test results approximately 7 days after the monitor has been mailed back to [employer].   Call [employer] Customer Care at [phone] if you have questions regarding your ZIO XT patch monitor.  Call them immediately if you see an orange light blinking on your monitor.   If your monitor falls off in less than 4 days contact our Monitor department at [phone].  If your monitor becomes loose or falls off after 4 days  call [employer] at [phone] for suggestions on securing your monitor.   Follow-Up: At [facility], you and your health needs are our priority.  As part of our continuing mission to provide you with exceptional heart care, we have created designated Provider Care Teams.  These Care Teams include your primary Cardiologist (physician) and Advanced Practice Providers (APPs -  Physician Assistants and Nurse Practitioners) who all work together to provide you with the care you need, when you need it.  Your next appointment:   AS NEEDED   The format for your next appointment:   In Person  Provider:   Branch, Mary E, MD          

## 2021-11-22 NOTE — Progress Notes (Unsigned)
Enrolled for [employer] to mail a ZIO XT long term holter monitor to the patients address on file.  

## 2021-11-28 DIAGNOSIS — R002 Palpitations: Secondary | ICD-10-CM | POA: Diagnosis not present

## 2021-12-04 ENCOUNTER — Ambulatory Visit (HOSPITAL_COMMUNITY): Payer: No Typology Code available for payment source | Attending: Internal Medicine

## 2021-12-04 DIAGNOSIS — R002 Palpitations: Secondary | ICD-10-CM

## 2021-12-04 LAB — ECHOCARDIOGRAM COMPLETE
Area-P 1/2: 3.37 cm2
S' Lateral: 2.1 cm

## 2021-12-14 ENCOUNTER — Other Ambulatory Visit: Payer: No Typology Code available for payment source

## 2021-12-14 DIAGNOSIS — R7989 Other specified abnormal findings of blood chemistry: Secondary | ICD-10-CM

## 2021-12-14 NOTE — Addendum Note (Signed)
Addended by: Geradine Girt D on: 12/14/2021 12:33 PM   Modules accepted: Orders

## 2021-12-15 LAB — VITAMIN D 25 HYDROXY (VIT D DEFICIENCY, FRACTURES): Vit D, 25-Hydroxy: 74 ng/mL (ref 30–100)

## 2021-12-15 LAB — CALCIUM: Calcium: 9.5 mg/dL (ref 8.6–10.4)

## 2022-01-09 ENCOUNTER — Telehealth: Payer: Self-pay | Admitting: Internal Medicine

## 2022-01-09 MED ORDER — ASPIRIN 81 MG PO TBEC: 81.0000 mg | DELAYED_RELEASE_TABLET | Freq: Every day | ORAL | 3 refills | Status: AC

## 2022-01-09 MED ORDER — METOPROLOL SUCCINATE ER 25 MG PO TB24: 25.0000 mg | ORAL_TABLET | Freq: Every day | ORAL | 3 refills | Status: DC

## 2022-01-09 NOTE — Telephone Encounter (Signed)
Patient c/o Palpitations:  High priority if patient c/o lightheadedness, shortness of breath, or chest pain  How long have you had palpitations/irregular HR/ Afib? Are you having the symptoms now? Started 8 weeks ago. And no symptoms now.   Are you currently experiencing lightheadedness, SOB or CP? No   Do you have a history of afib (atrial fibrillation) or irregular heart rhythm? Yes.   Have you checked your BP or HR? (document readings if available): Not this morning   Are you experiencing any other symptoms? No.

## 2022-01-09 NOTE — Telephone Encounter (Addendum)
Patient informed to start taking metoprolol XL 25mg  daily. Patient education of medication. Prescription sent to patient's preferred pharmacy. Patient is also taking ASA 81mg  daily. Should she continue with ASA?

## 2022-01-09 NOTE — Telephone Encounter (Signed)
Spoke with patient who reported having periods of palpitations that last up to 1 1/2 hours. It makes her feel tired, out of breath, and the need to cough. She is staying hydrated and avoids caffeine and ETOH. Recommended that if she has SOB and dizziness, to be taken to the ED. She verbalized understanding. She wants to know if there is a medication she can take to decease the palpitations.

## 2022-01-09 NOTE — Addendum Note (Signed)
Addended by: Velora Mediate F on: 01/09/2022 01:53 PM   Modules accepted: Orders

## 2022-02-05 ENCOUNTER — Encounter: Payer: Self-pay | Admitting: Internal Medicine

## 2022-02-16 ENCOUNTER — Encounter: Payer: Self-pay | Admitting: Internal Medicine

## 2022-02-16 NOTE — Telephone Encounter (Signed)
Spoke with patient after receiving her message and speaking with the pharmacist about her stopping metoprolol 25mg  daily due to depression.  Gave patient the following info: [facility] gave orders that patient may discontinue taking metoprolol 25mg  and does not need to taper off of it because it is a low dose. Patient asked to call office if she needs to be seen due to palpitations before her scheduled appointment on 03/06/22. Patient verbalized understanding.

## 2022-02-22 ENCOUNTER — Telehealth: Payer: Self-pay | Admitting: Internal Medicine

## 2022-02-22 NOTE — Telephone Encounter (Signed)
Martha Cabrera [phone]  [name] called to see if she could get an appointment to come in and talk with you, she is really depressed.

## 2022-02-23 NOTE — Telephone Encounter (Signed)
Patient will call back to schedule ?

## 2022-03-02 ENCOUNTER — Encounter: Payer: Self-pay | Admitting: Internal Medicine

## 2022-03-02 ENCOUNTER — Ambulatory Visit (INDEPENDENT_AMBULATORY_CARE_PROVIDER_SITE_OTHER): Payer: No Typology Code available for payment source | Admitting: Internal Medicine

## 2022-03-02 VITALS — BP 110/70 | HR 76 | Temp 98.1°F | Ht 68.0 in | Wt 139.0 lb

## 2022-03-02 DIAGNOSIS — F439 Reaction to severe stress, unspecified: Secondary | ICD-10-CM | POA: Diagnosis not present

## 2022-03-02 NOTE — Patient Instructions (Addendum)
Patient to call [facility] for appt. Needs counseling and medication consult for situational stress.  Follow-up with Dr. Harl Bowie, cardiologist for palpitations

## 2022-03-02 NOTE — Progress Notes (Signed)
Subjective:    Patient ID: Martha Cabrera, female    DOB: 1960-04-08, 62 y.o.   MRN: [id number]  HPI Pleasant 62 year old female seen today with situational stress and anxiety.  Reports feeling overwhelmed.  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   Previously worked for [employer] for [employer].  She is a former Chief Executive Officer.  She is married and has 2 sons.  She was seen in June 2023 for health maintenance exam.  She has a history of migraine headaches, pure hypercholesterolemia and vitamin D deficiency.  Occasionally has bouts of cystitis but infrequently.    She was seen in the office September 19 with an acute urinary tract infection.  She was treated with Cipro.  On September 27, she called saying she was having spells of feeling winded and almost short of breath.  She said the pulse in her neck did not feel normal and she thought she might be in A-fib and said the spells had been occurring for some 1 to 2 weeks.  She asked to come to the office but was directed to the emergency department.  She was seen at the emergency department at [facility]. EKG showed sinus bradycardia no acute changes.  Chest x-ray was negative.  CBC, B met and troponin were normal.  It was suggested she wear a cardiac monitor.  I saw her  on Sept.29- 2 days after her ED visit.  Apparently the concerning episode started when she was walking with some friends.  She had had a couple of glasses of champagne the evening before.  Does not drink alcohol on a regular basis.  While walking, she noticed palpitations without chest pain.  She stopped a minute to rest.  Did not walk all that far.   After emergency department visit she was referred to Dr. Harl Bowie.  She wore Holter monitor for 48 hours and had exercise tolerance test ordered by Dr. Harl Bowie in early October  both of which were normal.  EKG at that time was normal.  However Zio monitoring did show atrial flutter.  She was  advised to take baby aspirin and follow-up in 3 months.  She has a history of pure hypercholesterolemia and was started on low-dose Crestor 5 mg weekly in early 2023 as total cholesterol was 233, HDL 80, LDL 138.  By June her lipid panel was normal.  She had previously seen a Cardiologist in [facility], Dr. Geraldo Pitter for chest pressure and palpitations in 2019.  She was planning a trip to [place] at the time.  No arrhythmia was detected.  Patient reports being somewhat overwhelmed with new job with a lot to learn.  Says she is beginning to have some concerns about the job.    Review of Systems see above     Objective:   Physical Exam  Blood pressure 110/70, pulse 76, temperature 98.1 degrees pulse oximetry 99% weight 139 pounds BMI 21.13 Her chest is clear.  Cardiac exam: Regular rate and rhythm.  No lower extremity edema.  Affect is somewhat anxious     Assessment & Plan:   Recent bout of palpitations but negative workup in the emergency department.  She has appointment soon to see Dr. Harl Bowie, Cardiologist.  Initially had palpitations and saw cardiologist in [facility] in 2019.  Has had atrial flutter on ZIO monitor in October with Dr. Harl Bowie.  Recent emergency department visit was unremarkable and MI was ruled out  Situational stress-overwhelmed at work  Plan: She may  need to seek counseling regarding her job situation.  She has considerable situational stress at this time.  Follow-up with cardiologist.

## 2022-03-06 ENCOUNTER — Ambulatory Visit: Payer: No Typology Code available for payment source | Admitting: Internal Medicine

## 2022-03-08 ENCOUNTER — Encounter: Payer: Self-pay | Admitting: Internal Medicine

## 2022-03-08 ENCOUNTER — Ambulatory Visit: Payer: No Typology Code available for payment source | Attending: Internal Medicine | Admitting: Internal Medicine

## 2022-03-08 VITALS — BP 102/64 | HR 77 | Ht 68.0 in | Wt 138.6 lb

## 2022-03-08 DIAGNOSIS — I483 Typical atrial flutter: Secondary | ICD-10-CM | POA: Diagnosis not present

## 2022-03-08 DIAGNOSIS — R002 Palpitations: Secondary | ICD-10-CM

## 2022-03-08 MED ORDER — DILTIAZEM HCL 30 MG PO TABS: 30.0000 mg | ORAL_TABLET | Freq: Four times a day (QID) | ORAL | 3 refills | Status: DC | PRN

## 2022-03-08 NOTE — Patient Instructions (Signed)
Medication Instructions:  Your physician has recommended you make the following change in your medication:   -Start diltiazem (cardizem) 30mg  every 6 hours as needed for a-flutter episodes.   *If you need a refill on your cardiac medications before your next appointment, please call your pharmacy*   Follow-Up: At [facility], you and your health needs are our priority.  As part of our continuing mission to provide you with exceptional heart care, we have created designated Provider Care Teams.  These Care Teams include your primary Cardiologist (physician) and Advanced Practice Providers (APPs -  Physician Assistants and Nurse Practitioners) who all work together to provide you with the care you need, when you need it.  We recommend signing up for the patient portal called "MyChart".  Sign up information is provided on this After Visit Summary.  MyChart is used to connect with patients for Virtual Visits (Telemedicine).  Patients are able to view lab/test results, encounter notes, upcoming appointments, etc.  Non-urgent messages can be sent to your provider as well.   To learn more about what you can do with MyChart, go to [email or web address].    Your next appointment:   6 month(s)  Provider:   Janina Mayo, MD

## 2022-03-08 NOTE — Progress Notes (Signed)
Cardiology Office Note:    Date:  03/08/2022   ID:  Martha Cabrera, DOB [birth year], MRN [id number]  PCP:  Margaree Mackintosh, MD   [facility] Providers Cardiologist:  Maisie Fus, MD     Referring MD: Margaree Mackintosh, MD   No chief complaint on file. Palpitations  History of Present Illness:    Martha Cabrera is a 62 y.o. female with no cardiac hx, referral for palpitations. TSH is normal. Presented to the ED with SOB/palpitations. Trop negative. She noted episodes of 30-45 min lasting 3 days. Noted ?MVP hx. She notes some windedness and an irregular rhythm. She feels well when she exercises. She saw Dr. Tomie China in 2019 and had a negative holter. She has a normal ETT at that time. She had a cac score done which was zero.   Interim Hx 1/18 She underwent TTE, there was no MVP. She had low burden of atrial flutter. Her Chads2vasc is low.  She is quite symptomatic from this. She did not tolerate metoprolol , she felt fatigued. She is very active and healthy. Her BP is 102/64 mmHg  Past Surgical History:  Procedure Laterality Date   AUGMENTATION MAMMAPLASTY  1989   RETRO PECTORAL SALINE   BLADDER SUSPENSION     BREAST ENHANCEMENT SURGERY     1990   BREAST SURGERY  1990   Breast Augmentation    Current Medications: Current Meds  Medication Sig   aspirin EC 81 MG tablet Take 1 tablet (81 mg total) by mouth daily. Swallow whole.   Cholecalciferol (VITAMIN D) 125 MCG (5000 UT) CAPS Take by mouth.   diltiazem (CARDIZEM) 30 MG tablet Take 1 tablet (30 mg total) by mouth every 6 (six) hours as needed.   fluconazole (DIFLUCAN) 200 MG tablet    rosuvastatin (CRESTOR) 5 MG tablet Take 1 tablet (5 mg total) by mouth daily.   SUMAtriptan (IMITREX) 100 MG tablet Take 1 tablet (100 mg total) by mouth as needed.     Allergies:   Metoprolol succinate [metoprolol tartrate]   Social History   Socioeconomic History   Marital status: Married    Spouse name: Not on file    Number of children: Not on file   Years of education: Not on file   Highest education level: Not on file  Occupational History   Not on file  Tobacco Use   Smoking status: Former    Years: 2.00    Types: Cigarettes    Quit date: 10/09/1977    Years since quitting: 44.4   Smokeless tobacco: Never  Substance and Sexual Activity   Alcohol use: Yes    Alcohol/week: 1.0 - 2.0 standard drink of alcohol    Types: 1 - 2 Glasses of wine per week    Comment: 1-2 glasses a wk or less   Drug use: No   Sexual activity: Yes    Birth control/protection: I.U.D.  Other Topics Concern   Not on file  Social History Narrative   Not on file   Social Determinants of Health   Financial Resource Strain: Not on file  Food Insecurity: Not on file  Transportation Needs: Not on file  Physical Activity: Not on file  Stress: Not on file  Social Connections: Not on file     Family History: The patient's family history includes ADD / ADHD in her son; Anxiety disorder in her son; Crohn's disease in her father; Depression in her mother and son; Diabetes in her  father; Hyperlipidemia in her father; Hypertension in her brother and father. There is no history of Colon cancer, Esophageal cancer, or Stomach cancer.  ROS:   Please see the history of present illness.     All other systems reviewed and are negative.  EKGs/Labs/Other Studies Reviewed:    The following studies were reviewed today:   EKG:  EKG is  ordered today.  The ekg ordered today demonstrates   11/22/2021- NSR  Recent Labs: 07/27/2021: ALT 26 11/15/2021: BUN 20; Creatinine, Ser 0.68; Hemoglobin 14.1; Magnesium 2.0; Platelets 257; Potassium 4.1; Sodium 140; TSH 1.486   Recent Lipid Panel    Component Value Date/Time   CHOL 164 07/27/2021 0918   CHOL 200 (H) 08/12/2017 0931   TRIG 40 07/27/2021 0918   HDL 82 07/27/2021 0918   HDL 85 08/12/2017 0931   CHOLHDL 2.0 07/27/2021 0918   LDLCALC 71 07/27/2021 0918     Risk  Assessment/Calculations:     CHA2DS2-VASc Score = 1   This indicates a 0.6% annual risk of stroke. The patient's score is based upon: CHF History: 0 HTN History: 0 Diabetes History: 0 Stroke History: 0 Vascular Disease History: 0 Age Score: 0 Gender Score: 1    The 10-year ASCVD risk score (Arnett DK, et al., 2019) is: 1.4%   Values used to calculate the score:     Age: 56 years     Sex: Female     Is Non-Hispanic African American: No     Diabetic: No     Tobacco smoker: No     Systolic Blood Pressure: 426 mmHg     Is BP treated: No     HDL Cholesterol: 82 mg/dL     Total Cholesterol: 164 mg/dL   Physical Exam:    VS:  Vitals:   03/08/22 0823  BP: 102/64  Pulse: 77  SpO2: 96%        BP 102/64 (BP Location: Left Arm, Patient Position: Sitting, Cuff Size: Normal)   Pulse 77   Ht 5\' 8"  (1.727 m)   Wt 138 lb 9.6 oz (62.9 kg)   LMP 06/04/2015   SpO2 96%   BMI 21.07 kg/m     Wt Readings from Last 3 Encounters:  03/08/22 138 lb 9.6 oz (62.9 kg)  03/02/22 139 lb (63 kg)  11/22/21 142 lb 12.8 oz (64.8 kg)     GEN:  Well nourished, well developed in no acute distress HEENT: Normal NECK: No JVD; No carotid bruits LYMPHATICS: No lymphadenopathy CARDIAC: RRR, no murmurs, rubs, gallops RESPIRATORY:  Clear to auscultation without rales, wheezing or rhonchi  ABDOMEN: Soft, non-tender, non-distended MUSCULOSKELETAL:  No edema; No deformity  SKIN: Warm and dry NEUROLOGIC:  Alert and oriented x 3 PSYCHIATRIC:  Normal affect   ASSESSMENT:   Atrial Flutter:  Atrial flutter with variable AV block. Looks typical. She had palpitations on her initial visit. She had very low burden of atrial flutter. Her Chads2vasc=1. She can take asa. His echo showed normal LV function. LA size is normal. She is a good ablation candidate if her burden increases.She trailed metoprolol and it contributed to her depressive symptoms.  PLAN:    In order of problems listed above:  EP  referral for consideration  Dilt 30 mg Q6H Follow up 6 months      Medication Adjustments/Labs and Tests Ordered: Current medicines are reviewed at length with the patient today.  Concerns regarding medicines are outlined above.  Orders Placed This Encounter  Procedures  Ambulatory referral to Cardiac Electrophysiology   Meds ordered this encounter  Medications   diltiazem (CARDIZEM) 30 MG tablet    Sig: Take 1 tablet (30 mg total) by mouth every 6 (six) hours as needed.    Dispense:  30 tablet    Refill:  3    Patient Instructions  Medication Instructions:  Your physician has recommended you make the following change in your medication:   -Start diltiazem (cardizem) 30mg  every 6 hours as needed for a-flutter episodes.   *If you need a refill on your cardiac medications before your next appointment, please call your pharmacy*   Follow-Up: At [facility], you and your health needs are our priority.  As part of our continuing mission to provide you with exceptional heart care, we have created designated Provider Care Teams.  These Care Teams include your primary Cardiologist (physician) and Advanced Practice Providers (APPs -  Physician Assistants and Nurse Practitioners) who all work together to provide you with the care you need, when you need it.  We recommend signing up for the patient portal called "MyChart".  Sign up information is provided on this After Visit Summary.  MyChart is used to connect with patients for Virtual Visits (Telemedicine).  Patients are able to view lab/test results, encounter notes, upcoming appointments, etc.  Non-urgent messages can be sent to your provider as well.   To learn more about what you can do with MyChart, go to [email or web address].    Your next appointment:   6 month(s)  Provider:   Janina Mayo, MD      Signed, Janina Mayo, MD  03/08/2022 9:01 AM    [place]

## 2022-03-12 ENCOUNTER — Ambulatory Visit: Payer: No Typology Code available for payment source | Admitting: Internal Medicine

## 2022-03-13 ENCOUNTER — Telehealth: Payer: Self-pay | Admitting: Internal Medicine

## 2022-03-13 ENCOUNTER — Encounter: Payer: Self-pay | Admitting: Internal Medicine

## 2022-03-13 ENCOUNTER — Ambulatory Visit (INDEPENDENT_AMBULATORY_CARE_PROVIDER_SITE_OTHER): Payer: No Typology Code available for payment source | Admitting: Internal Medicine

## 2022-03-13 VITALS — BP 122/80 | HR 68 | Temp 98.1°F | Ht 68.0 in | Wt 142.0 lb

## 2022-03-13 DIAGNOSIS — N949 Unspecified condition associated with female genital organs and menstrual cycle: Secondary | ICD-10-CM | POA: Diagnosis not present

## 2022-03-13 MED ORDER — CIPROFLOXACIN HCL 500 MG PO TABS: 500.0000 mg | ORAL_TABLET | Freq: Two times a day (BID) | ORAL | 0 refills | Status: DC

## 2022-03-13 MED ORDER — FLUCONAZOLE 150 MG PO TABS: 150.0000 mg | ORAL_TABLET | Freq: Once | ORAL | 0 refills | Status: DC

## 2022-03-13 NOTE — Telephone Encounter (Signed)
[name] [phone]  Martha Cabrera called to say she started having UTI symptoms yesterday, frequency, burning, so she went and got some AZO standard and started taking last night, but wanted to come in today, so I scheduled her for 2:00. The last AZO she took was around 9:00 am this morning.

## 2022-03-13 NOTE — Progress Notes (Addendum)
    Subjective:    Patient ID: Martha Cabrera , female    DOB: 01-04-1961, 62 y.o.    MRN: [id number]   62 y.o. Female presents today for possible UTI. Symptoms started yesterday.  Had E. Coli September 2023.          Patient Care Team: Elby Showers, MD as PCP - General (Internal Medicine) Janina Mayo, MD as PCP - Cardiology (Cardiology)   ROS- no nausea, vomiting, chills or fever      Objective:   Vitals: BP 122/80   Pulse 68   Temp 98.1 F (36.7 C) (Tympanic)   Ht 5\' 8"  (1.727 m)   Wt 142 lb (64.4 kg)   LMP 06/04/2015   SpO2 96%   BMI 21.59 kg/m    Physical Exam       Assessment & Plan:   Acute UTI    I,Alexis Herring,acting as a scribe for Elby Showers, MD.,have documented all relevant documentation on the behalf of Elby Showers, MD,as directed by  Elby Showers, MD while in the presence of Elby Showers, MD.   I, Elby Showers, MD, have reviewed all documentation for this visit. The documentation on 03/13/22 for the exam, diagnosis, procedures, and orders are all accurate and complete.

## 2022-03-14 NOTE — Patient Instructions (Signed)
She will take Cipro 500 mg twice daily for 7 days pending urine culture results.

## 2022-03-15 LAB — URINE CULTURE
MICRO NUMBER:: 14461124
SPECIMEN QUALITY:: ADEQUATE

## 2022-04-09 ENCOUNTER — Other Ambulatory Visit: Payer: Self-pay | Admitting: Internal Medicine

## 2022-04-16 NOTE — Progress Notes (Unsigned)
  Electrophysiology Office Note:    Date:  04/16/2022   ID:  Martha Cabrera, DOB 01/14/1961, MRN [id number]  [place] Cardiologist:  Janina Mayo, MD  [facility] Electrophysiologist:  Vickie Epley, MD   Referring MD: Janina Mayo, MD   Chief Complaint: Atrial flutter  History of Present Illness:    Martha Cabrera is a 62 y.o. female who I am seeing today for evaluation of atrial flutter at the request of Dr. Harl Bowie.  The patient last saw Dr. Harl Bowie March 08, 2022.  She does not have a significant cardiac history.  She is very active.  She has a CHA2DS2-VASc of 1 only for gender.  She is worn a monitor in November 2023 that showed a less than 1% burden of atrial flutter.  The longest episode lasted 2 minutes and 56 seconds with an average heart rate of 144 bpm      Their past medical, social and family history was reveiwed.   ROS:   Please see the history of present illness.    All other systems reviewed and are negative.  EKGs/Labs/Other Studies Reviewed:    The following studies were reviewed today:  December 23, 2021 ZIO monitor personally reviewed Less than 1% atrial flutter burden       Physical Exam:    VS:  LMP 06/04/2015     Wt Readings from Last 3 Encounters:  03/13/22 142 lb (64.4 kg)  03/08/22 138 lb 9.6 oz (62.9 kg)  03/02/22 139 lb (63 kg)     GEN: *** Well nourished, well developed in no acute distress CARDIAC: ***RRR, no murmurs, rubs, gallops RESPIRATORY:  Clear to auscultation without rales, wheezing or rhonchi       ASSESSMENT AND PLAN:    No diagnosis found.  #Atrial flutter Low burden, less than 1% CHA2DS2-VASc of 1 only for gender On aspirin 81 mg by mouth daily  Discussed treatment options for atrial flutter in detail today including continuing with a conservative management strategy/watchful waiting, pursuing rhythm control using catheter ablation or antiarrhythmic drugs.  CHA2DS2-VASc Score = 1  The  patient's score is based upon: CHF History: 0 HTN History: 0 Diabetes History: 0 Stroke History: 0 Vascular Disease History: 0 Age Score: 0 Gender Score: 1       Signed, Lysbeth Galas T. Quentin Ore, MD, [facility], [facility] 04/16/2022 10:18 PM    Electrophysiology [facility]

## 2022-04-17 ENCOUNTER — Encounter: Payer: Self-pay | Admitting: Cardiology

## 2022-04-17 ENCOUNTER — Ambulatory Visit: Payer: No Typology Code available for payment source | Attending: Cardiology | Admitting: Cardiology

## 2022-04-17 VITALS — BP 124/82 | HR 78 | Ht 68.0 in | Wt 142.6 lb

## 2022-04-17 DIAGNOSIS — I483 Typical atrial flutter: Secondary | ICD-10-CM

## 2022-04-17 NOTE — Progress Notes (Signed)
Electrophysiology Office Note:    Date:  04/17/2022   ID:  JADELYN SEITZ, DOB Mar 25, 1960, MRN [id number]  [place] Cardiologist:  Janina Mayo, MD  [facility] Electrophysiologist:  Vickie Epley, MD   Referring MD: Janina Mayo, MD   Chief Complaint: Atrial flutter  History of Present Illness:    Martha Cabrera is a 62 y.o. female who I am seeing today for evaluation of atrial flutter at the request of Dr. Harl Bowie.  The patient last saw Dr. Harl Bowie March 08, 2022.  She does not have a significant cardiac history.  She is very active.  She has a CHA2DS2-VASc of 1 only for gender.  She has worn a monitor in November 2023 that showed a less than 1% burden of atrial flutter.  The longest episode lasted 2 minutes and 56 seconds with an average heart rate of 144 bpm  Today, she reports her episodes of palpitations had been rare and intermittent for years. Then in September, her palpitations became more frequent and she had presented to the ED. Currently she is experiencing daily palpitations, or at least every other day (there has been some abatement of her palpitations). She had an episode this morning and monitored her heart rates which rapidly fluctuated between 56 and 90 bpm. Her palpitations have been associated with shortness of breath.  Previously she was intolerant of metoprolol. She states that the medication sent her into a depression. In January she was started on Effexor despite being off of metoprolol for a month.  In clinic today her blood pressure is 124/82 which she states is high for her. At home she normally sees readings such as 110/65.  In her family, her mother developed Afib in her 38's; she is nearly 61 years old now.   She denies any chest pain, or peripheral edema. No lightheadedness, headaches, syncope, orthopnea, or PND.     Their past medical, social and family history was reviewed.   ROS:   Please see the history of present illness.     (+) Palpitations (+) Shortness of breath All other systems reviewed and are negative.  EKGs/Labs/Other Studies Reviewed:    The following studies were reviewed today:  December 23, 2021 ZIO monitor personally reviewed Less than 1% atrial flutter burden      Physical Exam:    VS:  BP 124/82   Pulse 78   Ht '5\' 8"'$  (1.727 m)   Wt 142 lb 9.6 oz (64.7 kg)   LMP 06/04/2015   SpO2 98%   BMI 21.68 kg/m     Wt Readings from Last 3 Encounters:  04/17/22 142 lb 9.6 oz (64.7 kg)  03/13/22 142 lb (64.4 kg)  03/08/22 138 lb 9.6 oz (62.9 kg)     GEN: Well nourished, well developed in no acute distress CARDIAC: RRR, no murmurs, rubs, gallops RESPIRATORY:  Clear to auscultation without rales, wheezing or rhonchi       ASSESSMENT AND PLAN:    1. Typical atrial flutter (HCC)     #Atrial flutter Low burden, less than 1% CHA2DS2-VASc of 1 only for gender On aspirin 81 mg by mouth daily  Discussed treatment options for atrial flutter in detail today including continuing with a conservative management strategy/watchful waiting, pursuing rhythm control using catheter ablation or antiarrhythmic drugs.  She would like to pursue catheter ablation which I think is very reasonable.  We discussed how we have not observed any atrial fibrillation to date.  We discussed  the need for a rhythm surveillance strategy following ablation of atrial flutter.  I discussed using wearable's and loop recorder for atrial fibrillation surveillance.  CHA2DS2-VASc Score = 1  The patient's score is based upon: CHF History: 0 HTN History: 0 Diabetes History: 0 Stroke History: 0 Vascular Disease History: 0 Age Score: 0 Gender Score: 1  She will start Eliquis 4 weeks prior to the ablation procedure.  Will get a CBC today to establish a baseline hemoglobin.  When she starts the Eliquis, she will stop aspirin.  Will plan to continue Eliquis for 3 months following flutter ablation.       I,Mathew  Stumpf,acting as a Education administrator for Vickie Epley, MD.,have documented all relevant documentation on the behalf of Vickie Epley, MD,as directed by  Vickie Epley, MD while in the presence of Vickie Epley, MD.  I, Vickie Epley, MD, have reviewed all documentation for this visit. The documentation on 04/17/22 for the exam, diagnosis, procedures, and orders are all accurate and complete.   Signed, Hilton Cork. Quentin Ore, MD, [facility], [facility] 04/17/2022 12:54 PM    Electrophysiology [facility]

## 2022-04-17 NOTE — Patient Instructions (Addendum)
Medication Instructions:  Your physician has recommended you make the following change in your medication:  1) You will start Eliquis four weeks prior to your ablation (April 15th) and stop taking Aspirin.   *If you need a refill on your cardiac medications before your next appointment, please call your pharmacy*  Lab Work: TODAY: CBC and BMET ON May 6th: CBC and BMET  If you have labs (blood work) drawn today and your tests are completely normal, you will receive your results only by: [place] (if you have MyChart) OR A paper copy in the mail If you have any lab test that is abnormal or we need to change your treatment, we will call you to review the results.  Testing/Procedures: Your physician has recommended that you have an ablation. Catheter ablation is a medical procedure used to treat some cardiac arrhythmias (irregular heartbeats). During catheter ablation, a long, thin, flexible tube is put into a blood vessel in your groin (upper thigh), or neck. This tube is called an ablation catheter. It is then guided to your heart through the blood vessel. Radio frequency waves destroy small areas of heart tissue where abnormal heartbeats may cause an arrhythmia to start. Please see the instruction sheet given to you today.  Follow-Up: At [facility], you and your health needs are our priority.  As part of our continuing mission to provide you with exceptional heart care, we have created designated Provider Care Teams.  These Care Teams include your primary Cardiologist (physician) and Advanced Practice Providers (APPs -  Physician Assistants and Nurse Practitioners) who all work together to provide you with the care you need, when you need it.  Your next appointment:   We will call you to arrange follow up appointments.

## 2022-04-18 LAB — BASIC METABOLIC PANEL
BUN/Creatinine Ratio: 28 (ref 12–28)
BUN: 21 mg/dL (ref 8–27)
CO2: 26 mmol/L (ref 20–29)
Calcium: 9.7 mg/dL (ref 8.7–10.3)
Chloride: 100 mmol/L (ref 96–106)
Creatinine, Ser: 0.74 mg/dL (ref 0.57–1.00)
Glucose: 72 mg/dL (ref 70–99)
Potassium: 5.1 mmol/L (ref 3.5–5.2)
Sodium: 139 mmol/L (ref 134–144)
eGFR: 93 mL/min/{1.73_m2} (ref >59)

## 2022-04-18 LAB — CBC
Hematocrit: 41.4 % (ref 34.0–46.6)
Hemoglobin: 13.6 g/dL (ref 11.1–15.9)
MCH: 29.4 pg (ref 26.6–33.0)
MCHC: 32.9 g/dL (ref 31.5–35.7)
MCV: 89 fL (ref 79–97)
Platelets: 255 10*3/uL (ref 150–450)
RBC: 4.63 x10E6/uL (ref 3.77–5.28)
RDW: 12.7 % (ref 11.7–15.4)
WBC: 7.3 10*3/uL (ref 3.4–10.8)

## 2022-05-08 ENCOUNTER — Telehealth: Payer: Self-pay | Admitting: Cardiology

## 2022-05-08 NOTE — Telephone Encounter (Signed)
Patient is requesting call back to discuss ablation and needing second opinion. She states she does not want to schedule for ablation currently until after second opinion from [facility]

## 2022-05-08 NOTE — Telephone Encounter (Signed)
Spoke with the patient who states that she is getting a second opinion at [facility] in regards to having an ablation done. She states that she wasn't able to get an appointment with them until 5/28. Her ablation was scheduled for 5/13 so she would like to cancel for now. She will call us back if she decides to reschedule.

## 2022-06-11 ENCOUNTER — Other Ambulatory Visit: Payer: Self-pay | Admitting: Internal Medicine

## 2022-06-11 DIAGNOSIS — Z1231 Encounter for screening mammogram for malignant neoplasm of breast: Secondary | ICD-10-CM

## 2022-06-25 ENCOUNTER — Other Ambulatory Visit: Payer: No Typology Code available for payment source

## 2022-07-26 NOTE — Progress Notes (Signed)
Patient Care Team: Margaree Mackintosh, MD as PCP - General (Internal Medicine) Maisie Fus, MD as PCP - Cardiology (Cardiology) Lanier Prude, MD as PCP - Electrophysiology (Cardiology)  Visit Date: 08/02/22  Subjective:    Patient ID: Martha Cabrera , Female   DOB: March 01, 1960, 62 y.o.    MRN: [id number]   62 y.o. Female presents today for annual comprehensive physical exam.  History of hyperlipidemia treated with rosuvastatin 5 mg daily. Lipid panel normal.   History of migraine headaches treated with sumatriptan 100 mg as needed.  She was seen in the office November 07 2021 with an acute urinary tract infection.  She was treated with Cipro. Culture grew E.coli. Had similar episode with E.coli UTI January 2024. Will continue to monior this. No UTI symptoms today.  On September 27, she called saying she was having spells of feeling winded and almost short of breath.  She said the pulse in her neck did not feel normal and she thought she might be in A-fib and said the spells had been occurring for some 1 to 2 weeks.  She asked to come to the office but was directed to the emergency department.   She was seen at the emergency department at [facility]. EKG showed sinus bradycardia no acute changes.  Chest x-ray was negative.  CBC, B met and troponin were normal.  It was suggested she wear a cardiac monitor.  I saw her  on Sept.29- 2 days after her ED visit.  Apparently the concerning episode started when she was walking with some friends.  She had had a couple of glasses of champagne the evening before.  Does not drink alcohol on a regular basis.  While walking, she noticed palpitations without chest pain.  She stopped a minute to rest.  Did not walk all that far.    After emergency department visit she was referred to Dr. Wyline Mood.  She wore Holter monitor for 48 hours and had exercise tolerance test ordered by Dr. Wyline Mood in early October  both of which were normal.  EKG at that time  was normal.  However Zio monitoring did show atrial flutter.  Symptoms are better now. She was advised to take baby aspirin and follow-up in 3 months. She plans to see an electrophysiologist regarding possible ablation. Does not tolerate metoprolol due to depression. Taking Effexor-XR daily.  She had previously seen a Cardiologist in [facility], Dr. Tomie China for chest pressure and palpitations in 2019.   She and her husband contracted COVID-19 in October 2020 after hunting trip to [place] but recovered quickly.  Glucose normal. Kidney, liver functions normal. Electrolytes normal. Blood proteins normal. CBC normal. TSH at 2.22.  Recently seen by GYN physician, Dr. Noland Fordyce.  Mammogram last completed 08/02/21. No mammographic evidence for malignancy. Recommended repeat in 2024.  Colonoscopy last completed 04/11/12. Normal colon. She will call [facility].  Social History: Works for Audiological scientist. Previously worked for [employer] for [employer].  She is a former Administrator, sports.  She is married and has 2 sons   Family history: Father with history of hypertension age 65.  No history of heart disease.  Mother living age 41.  History reviewed. No pertinent past medical history.   Family History  Problem Relation Age of Onset   Depression Mother    Diabetes Father    Hyperlipidemia Father    Hypertension Father    Crohn's disease Father    Hypertension Brother  ADD / ADHD Son    Depression Son    Anxiety disorder Son    Colon cancer Neg Hx    Esophageal cancer Neg Hx    Stomach cancer Neg Hx     Social History   Social History Narrative   Not on file      Review of Systems  Constitutional:  Negative for chills, fever, malaise/fatigue and weight loss.  HENT:  Negative for hearing loss, sinus pain and sore throat.   Respiratory:  Negative for cough, hemoptysis and shortness of breath.   Cardiovascular:  Negative for chest  pain, palpitations, leg swelling and PND.  Gastrointestinal:  Negative for abdominal pain, constipation, diarrhea, heartburn, nausea and vomiting.  Genitourinary:  Negative for dysuria, frequency and urgency.  Musculoskeletal:  Negative for back pain, myalgias and neck pain.  Skin:  Negative for itching and rash.  Neurological:  Negative for dizziness, tingling, seizures and headaches.  Endo/Heme/Allergies:  Negative for polydipsia.  Psychiatric/Behavioral:  Negative for depression. The patient is not nervous/anxious.         Objective:   Vitals: BP 98/60   Pulse 76   Temp 97.6 F (36.4 C) (Tympanic)   Resp 16   Ht 5' 8.5" (1.74 m)   Wt 144 lb 4 oz (65.4 kg)   LMP 06/04/2015   SpO2 96%   BMI 21.61 kg/m    Physical Exam Vitals and nursing note reviewed.  Constitutional:      General: She is not in acute distress.    Appearance: Normal appearance. She is not ill-appearing or toxic-appearing.  HENT:     Head: Normocephalic and atraumatic.     Right Ear: Hearing, tympanic membrane, ear canal and external ear normal.     Left Ear: Hearing, tympanic membrane, ear canal and external ear normal.     Mouth/Throat:     Pharynx: Oropharynx is clear.  Eyes:     Extraocular Movements: Extraocular movements intact.     Pupils: Pupils are equal, round, and reactive to light.  Neck:     Thyroid: No thyroid mass, thyromegaly or thyroid tenderness.     Vascular: No carotid bruit.  Cardiovascular:     Rate and Rhythm: Normal rate and regular rhythm. No extrasystoles are present.    Pulses:          Dorsalis pedis pulses are 1+ on the right side and 1+ on the left side.     Heart sounds: Normal heart sounds. No murmur heard.    No friction rub. No gallop.  Pulmonary:     Effort: Pulmonary effort is normal.     Breath sounds: Normal breath sounds. No decreased breath sounds, wheezing, rhonchi or rales.  Chest:     Chest wall: No mass.  Abdominal:     Palpations: Abdomen is soft.  There is no hepatomegaly, splenomegaly or mass.     Tenderness: There is no abdominal tenderness.     Hernia: No hernia is present.  Musculoskeletal:     Cervical back: Normal range of motion.     Right lower leg: No edema.     Left lower leg: No edema.  Lymphadenopathy:     Cervical: No cervical adenopathy.     Upper Body:     Right upper body: No supraclavicular adenopathy.     Left upper body: No supraclavicular adenopathy.  Skin:    General: Skin is warm and dry.  Neurological:     General: No focal deficit present.  Mental Status: She is alert and oriented to person, place, and time. Mental status is at baseline.     Sensory: Sensation is intact.     Motor: Motor function is intact. No weakness.     Deep Tendon Reflexes: Reflexes are normal and symmetric.  Psychiatric:        Attention and Perception: Attention normal.        Mood and Affect: Mood normal.        Speech: Speech normal.        Behavior: Behavior normal.        Thought Content: Thought content normal.        Cognition and Memory: Cognition normal.        Judgment: Judgment normal.       Results:   Studies obtained and personally reviewed by me:  Mammogram last completed 08/02/21. No mammographic evidence for malignancy. Recommended repeat in 2024.  Colonoscopy last completed 04/11/12. Normal colon. Recommended repeat in 2024.  Labs:       Component Value Date/Time   NA 141 07/30/2022 0921   NA 139 04/17/2022 1309   K 4.9 07/30/2022 0921   CL 103 07/30/2022 0921   CO2 31 07/30/2022 0921   GLUCOSE 89 07/30/2022 0921   BUN 20 07/30/2022 0921   BUN 21 04/17/2022 1309   CREATININE 0.67 07/30/2022 0921   CALCIUM 9.4 07/30/2022 0921   PROT 6.6 07/30/2022 0921   PROT 6.7 08/12/2017 0931   ALBUMIN 4.5 08/12/2017 0931   AST 20 07/30/2022 0921   ALT 19 07/30/2022 0921   ALKPHOS 67 08/12/2017 0931   BILITOT 0.4 07/30/2022 0921   BILITOT 0.6 08/12/2017 0931   GFRNONAA >60 11/15/2021 1618    GFRNONAA 96 06/14/2020 0939   GFRAA 111 06/14/2020 0939     Lab Results  Component Value Date   WBC 5.0 07/30/2022   HGB 13.2 07/30/2022   HCT 40.3 07/30/2022   MCV 89.8 07/30/2022   PLT 234 07/30/2022    Lab Results  Component Value Date   CHOL 166 07/30/2022   HDL 84 07/30/2022   LDLCALC 69 07/30/2022   TRIG 44 07/30/2022   CHOLHDL 2.0 07/30/2022    No results found for: "HGBA1C"   Lab Results  Component Value Date   TSH 2.22 07/30/2022      Assessment & Plan:   Hyperlipidemia: treated with rosuvastatin 5 mg daily. Lipid panel normal.   Migraine headaches: treated with sumatriptan 100 mg as needed.  Atrial flutter: symptoms are better now. She plans to see an electrophysiologist regarding possible ablation. Does not tolerate metoprolol due to depression.  Hx of anxiety/depression currently Taking Effexor-XR daily. Feels well. Less anxious about life and job  Hx E.coli UTI x 2 episodes. No symptoms today  Recently seen by GYN physician, Dr. Noland Fordyce. Pelvic and breast exam deferred to GYN.  Mammogram: last completed 08/02/21. No mammographic evidence for malignancy. Recommended repeat in 2024.  Colonoscopy: last completed 04/11/12. Normal colon. We will call [facility] so she may schedule a repeat.  Vaccine counseling: will go to pharmacy for shingles, RSV vaccines. UTD on flu, tetanus vaccines.  Return in 1 year for health maintenance exam or as needed.    I,Alexander Ruley,acting as a Neurosurgeon for Margaree Mackintosh, MD.,have documented all relevant documentation on the behalf of Margaree Mackintosh, MD,as directed by  Margaree Mackintosh, MD while in the presence of Margaree Mackintosh, MD.   I, Margaree Mackintosh,  MD, have reviewed all documentation for this visit. The documentation on 08/08/22 for the exam, diagnosis, procedures, and orders are all accurate and complete.

## 2022-07-30 ENCOUNTER — Other Ambulatory Visit: Payer: No Typology Code available for payment source

## 2022-07-30 DIAGNOSIS — Z1329 Encounter for screening for other suspected endocrine disorder: Secondary | ICD-10-CM

## 2022-07-30 DIAGNOSIS — Z0000 Encounter for general adult medical examination without abnormal findings: Secondary | ICD-10-CM

## 2022-07-30 DIAGNOSIS — E7800 Pure hypercholesterolemia, unspecified: Secondary | ICD-10-CM

## 2022-07-31 LAB — CBC WITH DIFFERENTIAL/PLATELET
Absolute Monocytes: 435 cells/uL (ref 200–950)
Basophils Absolute: 50 cells/uL (ref 0–200)
Basophils Relative: 1 %
Eosinophils Absolute: 180 cells/uL (ref 15–500)
Eosinophils Relative: 3.6 %
HCT: 40.3 % (ref 35.0–45.0)
Hemoglobin: 13.2 g/dL (ref 11.7–15.5)
Lymphs Abs: 1430 cells/uL (ref 850–3900)
MCH: 29.4 pg (ref 27.0–33.0)
MCHC: 32.8 g/dL (ref 32.0–36.0)
MCV: 89.8 fL (ref 80.0–100.0)
MPV: 10.5 fL (ref 7.5–12.5)
Monocytes Relative: 8.7 %
Neutro Abs: 2905 cells/uL (ref 1500–7800)
Neutrophils Relative %: 58.1 %
Platelets: 234 10*3/uL (ref 140–400)
RBC: 4.49 10*6/uL (ref 3.80–5.10)
RDW: 12.7 % (ref 11.0–15.0)
Total Lymphocyte: 28.6 %
WBC: 5 10*3/uL (ref 3.8–10.8)

## 2022-07-31 LAB — COMPLETE METABOLIC PANEL WITH GFR
AG Ratio: 1.6 (calc) (ref 1.0–2.5)
ALT: 19 U/L (ref 6–29)
AST: 20 U/L (ref 10–35)
Albumin: 4.1 g/dL (ref 3.6–5.1)
Alkaline phosphatase (APISO): 66 U/L (ref 37–153)
BUN: 20 mg/dL (ref 7–25)
CO2: 31 mmol/L (ref 20–32)
Calcium: 9.4 mg/dL (ref 8.6–10.4)
Chloride: 103 mmol/L (ref 98–110)
Creat: 0.67 mg/dL (ref 0.50–1.05)
Globulin: 2.5 g/dL (calc) (ref 1.9–3.7)
Glucose, Bld: 89 mg/dL (ref 65–99)
Potassium: 4.9 mmol/L (ref 3.5–5.3)
Sodium: 141 mmol/L (ref 135–146)
Total Bilirubin: 0.4 mg/dL (ref 0.2–1.2)
Total Protein: 6.6 g/dL (ref 6.1–8.1)
eGFR: 99 mL/min/{1.73_m2} (ref >=60)

## 2022-07-31 LAB — LIPID PANEL
Cholesterol: 166 mg/dL (ref <200)
HDL: 84 mg/dL (ref >=50)
LDL Cholesterol (Calc): 69 mg/dL (calc)
Non-HDL Cholesterol (Calc): 82 mg/dL (calc) (ref <130)
Total CHOL/HDL Ratio: 2 (calc) (ref <5.0)
Triglycerides: 44 mg/dL (ref <150)

## 2022-07-31 LAB — TSH: TSH: 2.22 mIU/L (ref 0.40–4.50)

## 2022-08-02 ENCOUNTER — Ambulatory Visit: Payer: No Typology Code available for payment source | Admitting: Internal Medicine

## 2022-08-02 ENCOUNTER — Encounter: Payer: Self-pay | Admitting: Internal Medicine

## 2022-08-02 VITALS — BP 98/60 | HR 76 | Temp 97.6°F | Resp 16 | Ht 68.5 in | Wt 144.2 lb

## 2022-08-02 DIAGNOSIS — Z0000 Encounter for general adult medical examination without abnormal findings: Secondary | ICD-10-CM | POA: Diagnosis not present

## 2022-08-02 DIAGNOSIS — Z780 Asymptomatic menopausal state: Secondary | ICD-10-CM

## 2022-08-02 DIAGNOSIS — E7800 Pure hypercholesterolemia, unspecified: Secondary | ICD-10-CM | POA: Diagnosis not present

## 2022-08-02 DIAGNOSIS — Z1211 Encounter for screening for malignant neoplasm of colon: Secondary | ICD-10-CM

## 2022-08-02 DIAGNOSIS — Z8669 Personal history of other diseases of the nervous system and sense organs: Secondary | ICD-10-CM

## 2022-08-02 DIAGNOSIS — Z87440 Personal history of urinary (tract) infections: Secondary | ICD-10-CM

## 2022-08-02 DIAGNOSIS — Z124 Encounter for screening for malignant neoplasm of cervix: Secondary | ICD-10-CM

## 2022-08-02 LAB — POCT URINALYSIS DIPSTICK
Bilirubin, UA: NEGATIVE
Blood, UA: NEGATIVE
Glucose, UA: NEGATIVE
Ketones, UA: NEGATIVE
Leukocytes, UA: NEGATIVE
Nitrite, UA: NEGATIVE
Protein, UA: NEGATIVE
Spec Grav, UA: 1.02 (ref 1.010–1.025)
Urobilinogen, UA: 0.2 E.U./dL
pH, UA: 5 (ref 5.0–8.0)

## 2022-08-06 ENCOUNTER — Ambulatory Visit
Admission: RE | Admit: 2022-08-06 | Discharge: 2022-08-06 | Disposition: A | Discharge status: Home / Self Care | Payer: No Typology Code available for payment source | Source: Ambulatory Visit | Attending: Internal Medicine | Admitting: Internal Medicine

## 2022-08-06 DIAGNOSIS — Z1231 Encounter for screening mammogram for malignant neoplasm of breast: Secondary | ICD-10-CM

## 2022-08-08 NOTE — Patient Instructions (Signed)
It was a pleasure to see you today.  Labs are stable.  No urinary tract infection identified.  Continue current medications and may follow-up in 1 year or as needed.  We are glad you are feeling better.

## 2022-08-16 ENCOUNTER — Encounter: Payer: Self-pay | Admitting: Internal Medicine

## 2022-09-14 ENCOUNTER — Ambulatory Visit: Payer: No Typology Code available for payment source

## 2022-09-14 ENCOUNTER — Telehealth: Payer: Self-pay | Admitting: *Deleted

## 2022-09-14 NOTE — Telephone Encounter (Signed)
Attempt to reach pt for pre-visit. LM with call back #.le Will attempt to reach again in 5 min due to no other # listed in profile

## 2022-09-14 NOTE — Telephone Encounter (Signed)
Second attempt to reach pt  for pre-visit unsuccessful. LM with call back # and instructions to call # given and reschedule pre-viisit or per protocol procedure will be canceled at end of day.

## 2022-10-03 ENCOUNTER — Ambulatory Visit (AMBULATORY_SURGERY_CENTER): Payer: No Typology Code available for payment source

## 2022-10-03 ENCOUNTER — Encounter: Payer: Self-pay | Admitting: Internal Medicine

## 2022-10-03 VITALS — Ht 68.5 in | Wt 147.0 lb

## 2022-10-03 DIAGNOSIS — Z1211 Encounter for screening for malignant neoplasm of colon: Secondary | ICD-10-CM

## 2022-10-03 MED ORDER — NA SULFATE-K SULFATE-MG SULF 17.5-3.13-1.6 GM/177ML PO SOLN: 1.0000 | Freq: Once | ORAL | 0 refills | Status: AC

## 2022-10-03 NOTE — Addendum Note (Signed)
Addended by: Dallie Piles on: 10/03/2022 10:36 AM   Modules accepted: Orders, Level of Service

## 2022-10-03 NOTE — Progress Notes (Signed)
No egg or soy allergy known to patient  No issues known to pt with past sedation with any surgeries or procedures Patient denies ever being told they had issues or difficulty with intubation  No FH of Malignant Hyperthermia Pt is not on diet pills Pt is not on  home 02  Pt is not on blood thinners  Pt denies issues with chronic constipation  Patient states she has A flutter; ablation is recommended; patient currently wearing cardiac monitor; symptoms are sporadic Have any cardiac testing pending--above mentioned cardiac monitor Patient's chart reviewed by Cathlyn Parsons CNRA prior to previsit and patient appropriate for the [facility].  Previsit completed and red dot placed by patient's name on their procedure day (on provider's schedule).    Ambulates independently Pt instructed to use Singlecare.com or GoodRx for a price reduction on prep

## 2022-10-06 ENCOUNTER — Encounter: Payer: Self-pay | Admitting: Certified Registered Nurse Anesthetist

## 2022-10-12 ENCOUNTER — Encounter: Payer: Self-pay | Admitting: Internal Medicine

## 2022-10-12 ENCOUNTER — Ambulatory Visit (AMBULATORY_SURGERY_CENTER): Payer: No Typology Code available for payment source | Admitting: Internal Medicine

## 2022-10-12 VITALS — BP 106/68 | HR 60 | Temp 97.3°F | Resp 14 | Ht 68.5 in | Wt 147.0 lb

## 2022-10-12 DIAGNOSIS — D120 Benign neoplasm of cecum: Secondary | ICD-10-CM | POA: Diagnosis not present

## 2022-10-12 DIAGNOSIS — Z1211 Encounter for screening for malignant neoplasm of colon: Secondary | ICD-10-CM | POA: Diagnosis not present

## 2022-10-12 MED ORDER — SODIUM CHLORIDE 0.9 % IV SOLN: 500.0000 mL | Freq: Once | INTRAVENOUS | Status: DC

## 2022-10-12 NOTE — Progress Notes (Signed)
Called to room to assist during endoscopic procedure.  Patient ID and intended procedure confirmed with present staff. Received instructions for my participation in the procedure from the performing physician.  

## 2022-10-12 NOTE — Patient Instructions (Addendum)
Recommendation:           - Discharge patient to home (with escort).                           - Await pathology results.                           - The findings and recommendations were discussed                            with the patient.  YOU HAD AN ENDOSCOPIC PROCEDURE TODAY AT THE [facility]:   Refer to the procedure report that was given to you for any specific questions about what was found during the examination.  If the procedure report does not answer your questions, please call your gastroenterologist to clarify.  If you requested that your care partner not be given the details of your procedure findings, then the procedure report has been included in a sealed envelope for you to review at your convenience later.  YOU SHOULD EXPECT: Some feelings of bloating in the abdomen. Passage of more gas than usual.  Walking can help get rid of the air that was put into your GI tract during the procedure and reduce the bloating. If you had a lower endoscopy (such as a colonoscopy or flexible sigmoidoscopy) you may notice spotting of blood in your stool or on the toilet paper. If you underwent a bowel prep for your procedure, you may not have a normal bowel movement for a few days.  Please Note:  You might notice some irritation and congestion in your nose or some drainage.  This is from the oxygen used during your procedure.  There is no need for concern and it should clear up in a day or so.  SYMPTOMS TO REPORT IMMEDIATELY:  Following lower endoscopy (colonoscopy or flexible sigmoidoscopy):  Excessive amounts of blood in the stool  Significant tenderness or worsening of abdominal pains  Swelling of the abdomen that is new, acute  Fever of 100F or higher  For urgent or emergent issues, a gastroenterologist can be reached at any hour by calling [phone]. Do not use MyChart messaging for urgent concerns.    DIET:  We do recommend a small meal at first, but then you may  proceed to your regular diet.  Drink plenty of fluids but you should avoid alcoholic beverages for 24 hours.  ACTIVITY:  You should plan to take it easy for the rest of today and you should NOT DRIVE or use heavy machinery until tomorrow (because of the sedation medicines used during the test).    FOLLOW UP: Our staff will call the number listed on your records the next business day following your procedure.  We will call around 7:15- 8:00 am to check on you and address any questions or concerns that you may have regarding the information given to you following your procedure. If we do not reach you, we will leave a message.     If any biopsies were taken you will be contacted by phone or by letter within the next 1-3 weeks.  Please call us at [phone] if you have not heard about the biopsies in 3 weeks.    SIGNATURES/CONFIDENTIALITY: You and/or your care partner have signed paperwork which will be entered into your electronic medical record.  These  signatures attest to the fact that that the information above on your After Visit Summary has been reviewed and is understood.  Full responsibility of the confidentiality of this discharge information lies with you and/or your care-partner.

## 2022-10-12 NOTE — Progress Notes (Signed)
Sedate, gd SR, tolerated procedure well, VSS, report to RN 

## 2022-10-12 NOTE — Progress Notes (Signed)
GASTROENTEROLOGY PROCEDURE H&P NOTE   Primary Care Physician: Margaree Mackintosh, MD    Reason for Procedure:   Colon cancer screening  Plan:    Colonoscopy  Patient is appropriate for endoscopic procedure(s) in the ambulatory ([facility]) setting.  The nature of the procedure, as well as the risks, benefits, and alternatives were carefully and thoroughly reviewed with the patient. Ample time for discussion and questions allowed. The patient understood, was satisfied, and agreed to proceed.     HPI: Martha Cabrera is a 62 y.o. female who presents for colonoscopy for colon cancer screening. Denies blood in stools, changes in bowel habits, or unintentional weight loss. Denies family history of colon cancer. Last colonoscopy in 2014 was normal.   Past Medical History:  Diagnosis Date   Hyperlipidemia     Past Surgical History:  Procedure Laterality Date   AUGMENTATION MAMMAPLASTY  1989   RETRO PECTORAL SALINE   BLADDER SUSPENSION     BREAST ENHANCEMENT SURGERY     1990   BREAST SURGERY  1990   Breast Augmentation    Prior to Admission medications   Medication Sig Start Date End Date Taking? Authorizing Provider  aspirin EC 81 MG tablet Take 1 tablet (81 mg total) by mouth daily. Swallow whole. 01/09/22  Yes Maisie Fus, MD  Cholecalciferol (VITAMIN D) 125 MCG (5000 UT) CAPS Take by mouth.   Yes [provider]  rosuvastatin (CRESTOR) 5 MG tablet TAKE 1 TABLET(5 MG) BY MOUTH DAILY 04/09/22  Yes Baxley, Luanna Cole, MD  venlafaxine XR (EFFEXOR-XR) 37.5 MG 24 hr capsule Take 37.5 mg by mouth daily. 04/09/22  Yes [provider]  SUMAtriptan (IMITREX) 100 MG tablet Take 1 tablet (100 mg total) by mouth as needed. 12/08/18   Margaree Mackintosh, MD    Current Outpatient Medications  Medication Sig Dispense Refill   aspirin EC 81 MG tablet Take 1 tablet (81 mg total) by mouth daily. Swallow whole. 90 tablet 3   Cholecalciferol (VITAMIN D) 125 MCG (5000 UT) CAPS Take by  mouth.     rosuvastatin (CRESTOR) 5 MG tablet TAKE 1 TABLET(5 MG) BY MOUTH DAILY 90 tablet 3   venlafaxine XR (EFFEXOR-XR) 37.5 MG 24 hr capsule Take 37.5 mg by mouth daily.     SUMAtriptan (IMITREX) 100 MG tablet Take 1 tablet (100 mg total) by mouth as needed. 10 tablet 1   Current Facility-Administered Medications  Medication Dose Route Frequency Provider Last Rate Last Admin   0.9 %  sodium chloride infusion  500 mL Intravenous Once Imogene Burn, MD        Allergies as of 10/12/2022 - Review Complete 10/12/2022  Allergen Reaction Noted   Metoprolol succinate [metoprolol tartrate] Other (See Comments) 02/16/2022    Family History  Problem Relation Age of Onset   Depression Mother    Diabetes Father    Hyperlipidemia Father    Hypertension Father    Crohn's disease Father    Hypertension Brother    ADD / ADHD Son    Depression Son    Anxiety disorder Son    Colon cancer Neg Hx    Esophageal cancer Neg Hx    Stomach cancer Neg Hx    Rectal cancer Neg Hx     Social History   Socioeconomic History   Marital status: Married    Spouse name: Not on file   Number of children: Not on file   Years of education: Not on file  Highest education level: Not on file  Occupational History   Not on file  Tobacco Use   Smoking status: Former    Current packs/day: 0.00    Types: Cigarettes    Start date: 10/10/1975    Quit date: 10/09/1977    Years since quitting: 45.0   Smokeless tobacco: Never  Vaping Use   Vaping status: Never Used  Substance and Sexual Activity   Alcohol use: Not Currently    Comment: rarely   Drug use: No   Sexual activity: Yes    Birth control/protection: I.U.D.  Other Topics Concern   Not on file  Social History Narrative   Not on file   Social Determinants of Health   Financial Resource Strain: Not on file  Food Insecurity: Not on file  Transportation Needs: Not on file  Physical Activity: Not on file  Stress: Not on file  Social  Connections: Not on file  Intimate Partner Violence: Not on file    Physical Exam: Vital signs in last 24 hours: BP 94/61   Pulse 68   Temp (!) 97.3 F (36.3 C)   Ht 5' 8.5" (1.74 m)   Wt 147 lb (66.7 kg)   LMP 06/04/2015   SpO2 100%   BMI 22.03 kg/m  GEN: NAD EYE: Sclerae anicteric ENT: MMM CV: Non-tachycardic Pulm: No increased work of breathing GI: Soft, NT/ND NEURO:  Alert & Oriented   Eulah Pont, MD [facility]  10/12/2022 8:25 AM

## 2022-10-12 NOTE — Op Note (Signed)
[facility] Patient Name: Martha Cabrera Procedure Date: 10/12/2022 8:27 AM MRN: [id number] Endoscopist: Madelyn Brunner [place] , , [phone] Age: 62 Referring MD:  Date of Birth: [birth year] Gender: Female Account #: [id number] Procedure:                Colonoscopy Indications:              Screening for colorectal malignant neoplasm Medicines:                Monitored Anesthesia Care Procedure:                Pre-Anesthesia Assessment:                           - Prior to the procedure, a History and Physical                            was performed, and patient medications and                            allergies were reviewed. The patient's tolerance of                            previous anesthesia was also reviewed. The risks                            and benefits of the procedure and the sedation                            options and risks were discussed with the patient.                            All questions were answered, and informed consent                            was obtained. Prior Anticoagulants: The patient has                            taken no anticoagulant or antiplatelet agents. ASA                            Grade Assessment: II - A patient with mild systemic                            disease. After reviewing the risks and benefits,                            the patient was deemed in satisfactory condition to                            undergo the procedure.                           After obtaining informed consent, the colonoscope  was passed under direct vision. Throughout the                            procedure, the patient's blood pressure, pulse, and                            oxygen saturations were monitored continuously. The                            Olympus Scope SN [id number] was introduced through the                            anus and advanced to the the terminal ileum. The                             colonoscopy was performed without difficulty. The                            patient tolerated the procedure well. The quality                            of the bowel preparation was good. The terminal                            ileum, ileocecal valve, appendiceal orifice, and                            rectum were photographed. Scope In: 8:40:06 AM Scope Out: 8:54:17 AM Scope Withdrawal Time: 0 hours 9 minutes 33 seconds  Total Procedure Duration: 0 hours 14 minutes 11 seconds  Findings:                 The terminal ileum appeared normal.                           A 6 mm polyp was found in the cecum. The polyp was                            sessile. The polyp was removed with a cold snare.                            Resection and retrieval were complete.                           Non-bleeding internal hemorrhoids were found during                            retroflexion. Complications:            No immediate complications. Estimated Blood Loss:     Estimated blood loss was minimal. Impression:               - The examined portion of the ileum was normal.                           -  One 6 mm polyp in the cecum, removed with a cold                            snare. Resected and retrieved.                           - Non-bleeding internal hemorrhoids. Recommendation:           - Discharge patient to home (with escort).                           - Await pathology results.                           - The findings and recommendations were discussed                            with the patient. Dr Particia Lather "[place]" [place],  10/12/2022 8:56:33 AM

## 2022-10-12 NOTE — Progress Notes (Signed)
VS by CW  Pt's states no medical or surgical changes since previsit or office visit.  

## 2022-10-15 ENCOUNTER — Telehealth: Payer: Self-pay | Admitting: *Deleted

## 2022-10-15 NOTE — Telephone Encounter (Signed)
  Follow up Call-     10/12/2022    7:42 AM  Call back number  Post procedure Call Back phone  # [phone]  Permission to leave phone message Yes     Patient questions:  Do you have a fever, pain , or abdominal swelling? No. Pain Score  0 *  Have you tolerated food without any problems? Yes.    Have you been able to return to your normal activities? Yes.    Do you have any questions about your discharge instructions: Diet   No. Medications  No. Follow up visit  No.  Do you have questions or concerns about your Care? No.  Actions: * If pain score is 4 or above: No action needed, pain <4.

## 2022-10-16 ENCOUNTER — Encounter: Payer: Self-pay | Admitting: Internal Medicine

## 2022-11-26 ENCOUNTER — Other Ambulatory Visit: Payer: Self-pay

## 2022-11-26 MED ORDER — ROSUVASTATIN CALCIUM 5 MG PO TABS: 5.0000 mg | ORAL_TABLET | Freq: Every day | ORAL | 3 refills | Status: DC

## 2023-02-21 ENCOUNTER — Encounter: Payer: Self-pay | Admitting: Internal Medicine

## 2023-02-21 ENCOUNTER — Ambulatory Visit (INDEPENDENT_AMBULATORY_CARE_PROVIDER_SITE_OTHER): Payer: No Typology Code available for payment source | Admitting: Internal Medicine

## 2023-02-21 VITALS — BP 110/70 | HR 77 | Ht 68.5 in | Wt 149.0 lb

## 2023-02-21 DIAGNOSIS — R002 Palpitations: Secondary | ICD-10-CM | POA: Diagnosis not present

## 2023-02-21 DIAGNOSIS — I480 Paroxysmal atrial fibrillation: Secondary | ICD-10-CM

## 2023-02-21 DIAGNOSIS — Z8669 Personal history of other diseases of the nervous system and sense organs: Secondary | ICD-10-CM | POA: Diagnosis not present

## 2023-02-21 NOTE — Progress Notes (Signed)
 Patient Care Team: Perri Ronal PARAS, MD as PCP - General (Internal Medicine) Alvan Ronal BRAVO, MD as PCP - Cardiology (Cardiology) Cindie Ole DASEN, MD as PCP - Electrophysiology (Cardiology)  Visit Date: 02/21/23  Subjective:  Patient PI:Martha Cabrera,Female DOB:01-18-1961,63 y.o.  MRN:[id number]   63 y.o. Female presents today for acute visit regarding her  blood pressure. Patient has a past medical history of HLD, chest pains, and palpitations. Has been monitoring her blood pressure since 02/18/23 and would like her readings noted as being 126/84 - 113/72 - 139/81 - 124/82 - 127/81 - 126/81, which is slightly elevated from her baseline of 110/80. Recently, on 02/06/23, she was seen by Alm Needle, MD with [facility] for consult regarding PAF (Paroxymal Atrial Flutter).   He notes her low CHADS2 score of 0. Recommends continued close monitoring for recurrent symptoms. Is on low dose ASA 81 mg daily and Cardizem  CD 120 mg daily.    Past Medical History:  Diagnosis Date   Hyperlipidemia     Family History  Problem Relation Age of Onset   Depression Mother    Diabetes Father    Hyperlipidemia Father    Hypertension Father    Crohn's disease Father    Hypertension Brother    ADD / ADHD Son    Depression Son    Anxiety disorder Son    Colon cancer Neg Hx    Esophageal cancer Neg Hx    Stomach cancer Neg Hx    Rectal cancer Neg Hx    Social History   Social History Narrative   Not on file   Review of Systems  Constitutional:  Negative for fever and malaise/fatigue.  HENT:  Negative for congestion.   Eyes:  Negative for blurred vision.  Respiratory:  Negative for cough and shortness of breath.   Cardiovascular:  Negative for chest pain, palpitations and leg swelling.  Gastrointestinal:  Negative for vomiting.  Musculoskeletal:  Negative for back pain.  Skin:  Negative for rash.  Neurological:  Negative for loss of consciousness and headaches.      Objective:  Vitals: BP 110/70   Pulse 77   Ht 5' 8.5 (1.74 m)   Wt 149 lb (67.6 kg)   LMP 06/04/2015   SpO2 98%   BMI 22.33 kg/m   Physical Exam Vitals and nursing note reviewed.  Constitutional:      General: She is not in acute distress.    Appearance: Normal appearance. She is not toxic-appearing.  HENT:     Head: Normocephalic and atraumatic.  Cardiovascular:     Rate and Rhythm: Normal rate and regular rhythm. No extrasystoles are present.    Pulses: Normal pulses.     Heart sounds: Normal heart sounds. No murmur heard.    No friction rub. No gallop.  Pulmonary:     Effort: Pulmonary effort is normal. No respiratory distress.     Breath sounds: Normal breath sounds. No wheezing or rales.  Skin:    General: Skin is warm and dry.  Neurological:     Mental Status: She is alert and oriented to person, place, and time. Mental status is at baseline.  Psychiatric:        Mood and Affect: Mood normal.        Behavior: Behavior normal.        Thought Content: Thought content normal.        Judgment: Judgment normal.    Results:   Studies obtained and  personally reviewed by me:  AH CV WF MUSE - 02/06/2023 3:47 PM EST  This result has an attachment that is not available.  Ventricular Rate                62        BPM                 Atrial Rate                         62        BPM                 P-R Interval                      190       ms                   QRS Duration                    86        ms                   Q-T Interval                      424       ms                   QTC Calculation Bazett   430       ms                   Calculated P Axis             54        degrees             Calculated R Axis             81        degrees             Calculated T Axis             69        degrees              Sinus rhythm Nonspecific ST-T changes When compared with ECG of 25-Sep-2022 11 05, No significant change was found  Labs:      Component Value  Date/Time   NA 141 07/30/2022 0921   NA 139 04/17/2022 1309   K 4.9 07/30/2022 0921   CL 103 07/30/2022 0921   CO2 31 07/30/2022 0921   GLUCOSE 89 07/30/2022 0921   BUN 20 07/30/2022 0921   BUN 21 04/17/2022 1309   CREATININE 0.67 07/30/2022 0921   CALCIUM  9.4 07/30/2022 0921   PROT 6.6 07/30/2022 0921   PROT 6.7 08/12/2017 0931   ALBUMIN 4.5 08/12/2017 0931   AST 20 07/30/2022 0921   ALT 19 07/30/2022 0921   ALKPHOS 67 08/12/2017 0931   BILITOT 0.4 07/30/2022 0921   BILITOT 0.6 08/12/2017 0931   GFRNONAA >60 11/15/2021 1618   GFRNONAA 96 06/14/2020 0939   GFRAA 111 06/14/2020 0939   Lab Results  Component Value Date   WBC 5.0 07/30/2022   HGB 13.2 07/30/2022   HCT 40.3 07/30/2022   MCV 89.8 07/30/2022   PLT 234 07/30/2022   Lab Results  Component Value Date  CHOL 166 07/30/2022   HDL 84 07/30/2022   LDLCALC 69 07/30/2022   TRIG 44 07/30/2022   CHOLHDL 2.0 07/30/2022   Lab Results  Component Value Date   TSH 2.22 07/30/2022     Assessment & Plan:  Elevated Blood Pressure: Blood pressure is stable, no concerns. Continue to monitor. Cardizem  helps control BP.  Hx of migraine. Cardizem  may be of benefit.  Paroxysmal palpitations with hx of A-fib/flutter. Baby ASA suggested by Cardiologist. CHADS2 score is 0. Cardiologist recommends continued monitoring.  Is looking for new employment. Currently, has lI, Ronal JINNY Hailstone, MD, have reviewed all documentation for this visit. The documentation on 02/25/23 for the exam, diagnosis, procedures, and orders are all accurate and complete.ess situational stress with family.  Return in 6 months for regular follow-up, or as needed.  I,Emily Lagle,acting as a neurosurgeon for Ronal JINNY Hailstone, MD.,have documented all relevant documentation on the behalf of Ronal JINNY Hailstone, MD,as directed by  Ronal JINNY Hailstone, MD while in the presence of Ronal JINNY Hailstone, MD.   I, Ronal JINNY Hailstone, MD, have reviewed all documentation for this visit. The  documentation on 02/25/23 for the exam, diagnosis, procedures, and orders are all accurate and complete.

## 2023-02-21 NOTE — Patient Instructions (Addendum)
 BP is stable. She will continue to monitor BP readings at home.It was a pleasure to see you today. Agree with Cardiology Recommendations. Return in June for CPE.

## 2023-02-25 ENCOUNTER — Encounter: Payer: Self-pay | Admitting: Internal Medicine

## 2023-03-13 ENCOUNTER — Telehealth: Payer: No Typology Code available for payment source | Admitting: Internal Medicine

## 2023-03-13 ENCOUNTER — Telehealth: Payer: Self-pay | Admitting: Internal Medicine

## 2023-03-13 ENCOUNTER — Encounter: Payer: Self-pay | Admitting: Internal Medicine

## 2023-03-13 DIAGNOSIS — Z8669 Personal history of other diseases of the nervous system and sense organs: Secondary | ICD-10-CM

## 2023-03-13 DIAGNOSIS — U071 COVID-19: Secondary | ICD-10-CM

## 2023-03-13 DIAGNOSIS — I4892 Unspecified atrial flutter: Secondary | ICD-10-CM

## 2023-03-13 MED ORDER — BENZONATATE 100 MG PO CAPS
100.0000 mg | ORAL_CAPSULE | Freq: Three times a day (TID) | ORAL | 0 refills | Status: AC | PRN
Start: 2023-03-13 — End: ?

## 2023-03-13 MED ORDER — NIRMATRELVIR/RITONAVIR (PAXLOVID)TABLET: 3.0000 | ORAL_TABLET | Freq: Two times a day (BID) | ORAL | 0 refills | Status: AC

## 2023-03-13 NOTE — Telephone Encounter (Signed)
Called and left patient a message to call back, I seen where she had an appointment on Thursday for a flu test with clinical support, which we do not do, so I wanted to see if she needed a flu shot.

## 2023-03-13 NOTE — Progress Notes (Signed)
I connected today with Martha Cabrera via interactive audio and video telecommunications.  She is at her home and I am at my office.  She is agreeable to visit in this format today.  She has tested positive this morning for COVID-19.  Began having some coughing on Monday evening January 20.  Has had temp up to 100 degrees.  Does not feel all that badly.  Not a lot of myalgias.  No vomiting.  Able to stay hydrated.  She is seen virtually in no acute distress.  She is not tachypneic.  Her facial color is good.  She is able to give a clear concise history.  Not heard to be coughing on this video visit.  She has a history of paroxysmal atrial flutter and is seen by [facility] (Dr. Sampson Goon).  She is on Cardizem CD 120 mg daily this issue is currently stable.  She has mild hyperlipidemia and takes Crestor 5 mg daily.  History of migraine headaches treated with Imitrex.  History of anxiety and depression treated with Effexor XR  Impression: Acute COVID-19 virus infection  Our records indicates she has not had a recent COVID booster.  She must quarantine for 5 days.  She is disappointed she will need to cancel trip with friends this coming weekend.  We have sent in regular strength Paxlovid for her as well as Tessalon Perles 100 mg to take 3 times daily as needed for cough.  Rest and stay well-hydrated.  Walk some to prevent atelectasis.  Monitor pulse oximetry at home.  Call if symptoms worsen.  IMargaree Mackintosh, MD, have reviewed all documentation for this visit. The documentation on 03/13/23 for the exam, diagnosis, procedures, and orders are all accurate and complete.

## 2023-03-13 NOTE — Telephone Encounter (Signed)
Patient called back to say she needed a flu test she is sick with a cough that started on Monday and she had fever yesterday with congestion and body aches. I have ask her to go out and get a COVID/Flu combination test from the pharmacy and that Dr Lenord Fellers may be in later and we could possibly do a video visit after she has the results.

## 2023-03-13 NOTE — Patient Instructions (Signed)
We are sorry you are not feeling well today.  We have sent in regular strength Paxlovid to take as directed per pharmacy.  We are also sending in Tessalon Perles to take  up to 3 times daily as needed for cough.  Rest and stay well-hydrated.  Walk around  home some to prevent atelectasis and monitor pulse oximetry at home.  Call if pulse oximetry is not above 93% or symptoms worsen.  Quarantine for 5 days.

## 2023-03-14 ENCOUNTER — Encounter: Payer: Self-pay | Admitting: Internal Medicine

## 2023-03-14 ENCOUNTER — Ambulatory Visit: Payer: No Typology Code available for payment source

## 2023-03-14 ENCOUNTER — Ambulatory Visit: Payer: No Typology Code available for payment source | Admitting: Internal Medicine

## 2023-03-14 VITALS — Ht 68.5 in | Wt 149.0 lb

## 2023-03-14 DIAGNOSIS — J029 Acute pharyngitis, unspecified: Secondary | ICD-10-CM

## 2023-03-14 DIAGNOSIS — U071 COVID-19: Secondary | ICD-10-CM

## 2023-03-14 LAB — POCT RAPID STREP A (OFFICE): Rapid Strep A Screen: NEGATIVE

## 2023-03-14 NOTE — Progress Notes (Signed)
Patient Care Team: Margaree Mackintosh, MD as PCP - General (Internal Medicine) Maisie Fus, MD as PCP - Cardiology (Cardiology) Lanier Prude, MD as PCP - Electrophysiology (Cardiology)  Visit Date: 03/14/23  Subjective:   Chief Complaint  Patient presents with   Sore Throat   Patient OZ:DGUYQI B Shimkus,Female DOB:[birth year],62 y.o. HKV:[id number]   63 y.o. Female presents today for concern about possible strep pharyngitis. Patient is here for Streptococcal pharyngitis Screening. Seen yesterday( 03/13/23) via Video Visit for Covid-19 at which time she reported positive Covid 19 home test. Subsequently, she was prescribed Paxlovid and Tessalon Perles. Today ,she called about being tested for strep throat. Someone told her she could have both strep throat and Covid 19 at the same time.  Allergies  Allergen Reactions   Metoprolol Succinate [Metoprolol Tartrate] Other (See Comments)    Severe depression and negative thought pattern Metoprolol Succinate (Toprol XL)   Past Medical History:  Diagnosis Date   Hyperlipidemia     Family History  Problem Relation Age of Onset   Depression Mother    Diabetes Father    Hyperlipidemia Father    Hypertension Father    Crohn's disease Father    Hypertension Brother    ADD / ADHD Son    Depression Son    Anxiety disorder Son    Colon cancer Neg Hx    Esophageal cancer Neg Hx    Stomach cancer Neg Hx    Rectal cancer Neg Hx    Social Hx: married with adult children. Former Environmental consultant.  Review of Systems  Constitutional:  Positive for fever. Negative for malaise/fatigue.  HENT:  Positive for sore throat. Negative for congestion.   Eyes:  Negative for blurred vision.  Respiratory:  Positive for cough. Negative for shortness of breath.   Cardiovascular:  Negative for chest pain, palpitations and leg swelling.  Gastrointestinal:  Negative for vomiting.  Musculoskeletal:  Negative for back pain.  Skin:   Negative for rash.  Neurological:  Negative for loss of consciousness and headaches.    Objective:  Vitals: Ht 5' 8.5" (1.74 m)   Wt 149 lb (67.6 kg)   LMP 06/04/2015   BMI 22.33 kg/m   Physical Exam Vitals and nursing note reviewed.  Constitutional:      General: She is not in acute distress.    Appearance: Normal appearance. She is not toxic-appearing.  HENT:     Head: Normocephalic and atraumatic.  Pulmonary:     Effort: Pulmonary effort is normal.  Skin:    General: Skin is warm and dry.  Neurological:     Mental Status: She is alert and oriented to person, place, and time. Mental status is at baseline.  Psychiatric:        Mood and Affect: Mood normal.        Behavior: Behavior normal.        Thought Content: Thought content normal.        Judgment: Judgment normal.     Results:  Studies Obtained And Personally Reviewed By Me: Labs:     Component Value Date/Time   NA 141 07/30/2022 0921   NA 139 04/17/2022 1309   K 4.9 07/30/2022 0921   CL 103 07/30/2022 0921   CO2 31 07/30/2022 0921   GLUCOSE 89 07/30/2022 0921   BUN 20 07/30/2022 0921   BUN 21 04/17/2022 1309   CREATININE 0.67 07/30/2022 0921   CALCIUM 9.4 07/30/2022 0921   PROT 6.6 07/30/2022 0921  PROT 6.7 08/12/2017 0931   ALBUMIN 4.5 08/12/2017 0931   AST 20 07/30/2022 0921   ALT 19 07/30/2022 0921   ALKPHOS 67 08/12/2017 0931   BILITOT 0.4 07/30/2022 0921   BILITOT 0.6 08/12/2017 0931   GFRNONAA >60 11/15/2021 1618   GFRNONAA 96 06/14/2020 0939   GFRAA 111 06/14/2020 0939    Lab Results  Component Value Date   WBC 5.0 07/30/2022   HGB 13.2 07/30/2022   HCT 40.3 07/30/2022   MCV 89.8 07/30/2022   PLT 234 07/30/2022   Lab Results  Component Value Date   CHOL 166 07/30/2022   HDL 84 07/30/2022   LDLCALC 69 07/30/2022   TRIG 44 07/30/2022   CHOLHDL 2.0 07/30/2022    Lab Results  Component Value Date   TSH 2.22 07/30/2022   Results for orders placed or performed in visit on 03/14/23   POCT rapid strep A  Result Value Ref Range   Rapid Strep A Screen Negative Negative   Assessment & Plan:  Covid-19: Seen 03/13/23 via Video Visit for positive Covid-19 test.  Was placed on Paxlovid.Today endorses sore throat and wanted to r/o Strep as well.    Rapid strep test is NEG.  Plan: Continue Paxlovid until course is complete and, as needed, Tessalon Perles for cough. Stay  well hydrated and  walk some to prevent atelectasis. Monitor pulse oximetry at home. Call if symptoms worsen. Quarantine for 5 days.    I,Emily Lagle,acting as a Neurosurgeon for Margaree Mackintosh, MD.,have documented all relevant documentation on the behalf of Margaree Mackintosh, MD,as directed by  Margaree Mackintosh, MD while in the presence of Margaree Mackintosh, MD.   I, Margaree Mackintosh, MD, have reviewed all documentation for this visit. The documentation on 03/14/23 for the exam, diagnosis, procedures, and orders are all accurate and complete.

## 2023-03-14 NOTE — Patient Instructions (Signed)
Rapid Testing for strep throat is negative. Patient had home test positive for Covid 19 yesterday and was placed on Paxlovid after virtual visit. Take regular strength Paxlovid as prescribed. Quarantine at home for 5 days. Walk some to prevent atelectasis. Stay well hydrated and monitor pulse oximetry.

## 2023-03-25 NOTE — Telephone Encounter (Signed)
Copied from CRM [id number]. Topic: E2C2 Error Reporting - Scheduling Error >> Mar 13, 2023 10:16 AM Zannie Cove wrote: This CRM was created to begin the process of an appointment error investigation. Please see the corresponding attachments, forms, and notes for details. >> Mar 23, 2023  7:30 PM Katie L wrote: Agent education on correct appointment type for sick patients. KMS is being updated to identify what types of tests should be on provider schedule vs. nurse schedule.

## 2023-03-25 NOTE — Telephone Encounter (Signed)
The appointment was put in as a clinic appointment for a flu test which we do not do, if a patient needs a flu test it would be an office visit with a provider. I called the patient and had them do a home test for COVID and the flu and she had COVID so we did a Video visit on Wednesday and them we did a car visit for patient on Thursday for Strep Throat.

## 2023-07-24 ENCOUNTER — Other Ambulatory Visit: Payer: Self-pay | Admitting: Internal Medicine

## 2023-07-24 DIAGNOSIS — Z1231 Encounter for screening mammogram for malignant neoplasm of breast: Secondary | ICD-10-CM

## 2023-08-02 ENCOUNTER — Other Ambulatory Visit (INDEPENDENT_AMBULATORY_CARE_PROVIDER_SITE_OTHER): Payer: No Typology Code available for payment source

## 2023-08-02 ENCOUNTER — Encounter: Payer: No Typology Code available for payment source | Admitting: Internal Medicine

## 2023-08-02 DIAGNOSIS — M8580 Other specified disorders of bone density and structure, unspecified site: Secondary | ICD-10-CM

## 2023-08-02 DIAGNOSIS — I4892 Unspecified atrial flutter: Secondary | ICD-10-CM

## 2023-08-02 DIAGNOSIS — E7800 Pure hypercholesterolemia, unspecified: Secondary | ICD-10-CM

## 2023-08-02 DIAGNOSIS — Z0000 Encounter for general adult medical examination without abnormal findings: Secondary | ICD-10-CM

## 2023-08-02 DIAGNOSIS — Z1329 Encounter for screening for other suspected endocrine disorder: Secondary | ICD-10-CM

## 2023-08-03 LAB — CBC WITH DIFFERENTIAL/PLATELET
Absolute Lymphocytes: 1520 {cells}/uL (ref 850–3900)
Absolute Monocytes: 425 {cells}/uL (ref 200–950)
Basophils Absolute: 40 {cells}/uL (ref 0–200)
Basophils Relative: 0.8 %
Eosinophils Absolute: 130 {cells}/uL (ref 15–500)
Eosinophils Relative: 2.6 %
HCT: 43.5 % (ref 35.0–45.0)
Hemoglobin: 13.8 g/dL (ref 11.7–15.5)
MCH: 29.2 pg (ref 27.0–33.0)
MCHC: 31.7 g/dL — ABNORMAL LOW (ref 32.0–36.0)
MCV: 92 fL (ref 80.0–100.0)
MPV: 10.6 fL (ref 7.5–12.5)
Monocytes Relative: 8.5 %
Neutro Abs: 2885 {cells}/uL (ref 1500–7800)
Neutrophils Relative %: 57.7 %
Platelets: 244 10*3/uL (ref 140–400)
RBC: 4.73 10*6/uL (ref 3.80–5.10)
RDW: 12.7 % (ref 11.0–15.0)
Total Lymphocyte: 30.4 %
WBC: 5 10*3/uL (ref 3.8–10.8)

## 2023-08-03 LAB — COMPLETE METABOLIC PANEL WITHOUT GFR
AG Ratio: 2 (calc) (ref 1.0–2.5)
ALT: 14 U/L (ref 6–29)
AST: 20 U/L (ref 10–35)
Albumin: 4.3 g/dL (ref 3.6–5.1)
Alkaline phosphatase (APISO): 78 U/L (ref 37–153)
BUN: 20 mg/dL (ref 7–25)
CO2: 30 mmol/L (ref 20–32)
Calcium: 9.2 mg/dL (ref 8.6–10.4)
Chloride: 104 mmol/L (ref 98–110)
Creat: 0.7 mg/dL (ref 0.50–1.05)
Globulin: 2.2 g/dL (ref 1.9–3.7)
Glucose, Bld: 92 mg/dL (ref 65–99)
Potassium: 5.2 mmol/L (ref 3.5–5.3)
Sodium: 141 mmol/L (ref 135–146)
Total Bilirubin: 0.5 mg/dL (ref 0.2–1.2)
Total Protein: 6.5 g/dL (ref 6.1–8.1)

## 2023-08-03 LAB — LIPID PANEL
Cholesterol: 259 mg/dL — ABNORMAL HIGH (ref <200)
HDL: 84 mg/dL (ref >=50)
LDL Cholesterol (Calc): 160 mg/dL — ABNORMAL HIGH
Non-HDL Cholesterol (Calc): 175 mg/dL — ABNORMAL HIGH (ref <130)
Total CHOL/HDL Ratio: 3.1 (calc) (ref <5.0)
Triglycerides: 48 mg/dL (ref <150)

## 2023-08-03 LAB — TSH: TSH: 1.61 m[IU]/L (ref 0.40–4.50)

## 2023-08-05 ENCOUNTER — Encounter: Payer: Self-pay | Admitting: Internal Medicine

## 2023-08-05 ENCOUNTER — Ambulatory Visit: Payer: Self-pay | Admitting: Internal Medicine

## 2023-08-05 ENCOUNTER — Ambulatory Visit (INDEPENDENT_AMBULATORY_CARE_PROVIDER_SITE_OTHER): Payer: No Typology Code available for payment source | Admitting: Internal Medicine

## 2023-08-05 VITALS — BP 100/70 | HR 63 | Ht 68.5 in | Wt 147.0 lb

## 2023-08-05 DIAGNOSIS — Z0000 Encounter for general adult medical examination without abnormal findings: Secondary | ICD-10-CM

## 2023-08-05 DIAGNOSIS — I4892 Unspecified atrial flutter: Secondary | ICD-10-CM | POA: Diagnosis not present

## 2023-08-05 DIAGNOSIS — E7800 Pure hypercholesterolemia, unspecified: Secondary | ICD-10-CM

## 2023-08-05 DIAGNOSIS — Z780 Asymptomatic menopausal state: Secondary | ICD-10-CM

## 2023-08-05 DIAGNOSIS — Z8669 Personal history of other diseases of the nervous system and sense organs: Secondary | ICD-10-CM | POA: Diagnosis not present

## 2023-08-05 DIAGNOSIS — Z8639 Personal history of other endocrine, nutritional and metabolic disease: Secondary | ICD-10-CM

## 2023-08-05 LAB — POCT URINALYSIS DIP (CLINITEK)
Bilirubin, UA: NEGATIVE
Blood, UA: NEGATIVE
Glucose, UA: NEGATIVE mg/dL
Ketones, POC UA: NEGATIVE mg/dL
Nitrite, UA: NEGATIVE
POC PROTEIN,UA: NEGATIVE
Spec Grav, UA: 1.01 (ref 1.010–1.025)
Urobilinogen, UA: 0.2 U/dL
pH, UA: 7 (ref 5.0–8.0)

## 2023-08-05 NOTE — Progress Notes (Signed)
 Annual Wellness Visit   Patient Care Team: Martha Cabrera, Martha Meyers, MD as PCP - General (Internal Medicine) Martha Jungling Tomas Fountain, MD (Inactive) as PCP - Cardiology (Cardiology) Martha Byes, MD as PCP - Electrophysiology (Cardiology)  Visit Date: 08/05/23   Chief Complaint  Patient presents with   Annual Exam    Patient stopped taking CRESTOR  1-2 months ago, due to joint pain.    Subjective:  Patient: Martha Cabrera, Female DOB: February 06, 1961, 63 y.o. MRN: [id number] [name] is a 63 y.o. Female who presents today for her Annual Wellness Visit. Patient has Hx of migraine headaches; Recurrent urinary tract infection; Chest pain; Palpitations; Acute otitis externa of right ear; Fracture of great toe; Neck pain; Right ear impacted cerumen; Strain of neck muscle; and Pure hypercholesterolemia on their problem list.  History of Palpitations; Paroxymal Atrial Flutter treated with Diltiazem  120 mg daily. Previously evaluated by Dr. Lafayette Cabrera, Cardiologist, in [facility] for chest pressure and palpitations in 2019. On September 27th 2023, she had called explaining that she was having episodes of windedness and borderline SOB; said the pulse in her neck did not feel normal and she thought she may be in A-fib, and also said the spells had been going on for 1-2 weeks. She did initially want to come into the office, but was recommended to go to the ED, which she did and EKG showed sinus bradycardia w/o any acute changes. CXR negative. CBC, B-MET, and Troponins were normal. It was suggested she wear a cardiac monitor. She was seen in this office on 9/29, 2 days following her ED visit, where she explained the concerning episode started when she was walking with some friends after having had a couple of glasses of champagne the evening before (she does not drink alcohol often) and while walking she noticed palpitations w/o chest pain, so stopped a minutes to rest even though she had not walked all that far.  Subsequently, she was referred to Dr. Amanda Jungling, wore a Holter monitor for 48 hours, and had an ETT in early October of which both were normal. EKG at that time also normal. ZIO monitor did show Atrial Flutter. Symptoms improved. She was advised to take baby ASA and f/u in 3 months. Can not tolerate Metoprolol  due to depression. Followed by Dr. Rometta Cabrera, Cardiologist w/ [facility].   History of Hyperlipidemia previously treated with Rosuvastatin  5 mg daily. 08/02/2023 Lipid Panel: Cholesterol 259, elevated from 166; LDL 160, elevated from 69 in 07/2022; otherwise WNL. 2022 Coronary Cardiac Score: 0.    History of Migraine Headaches treated with Sumatriptan  100 mg as needed.   History of Vitamin-D Deficiency treated with Vitamin-D 5000 units.   History of Depression treated with Effexor-XR 37.5 mg daily.   Labs 08/02/2023 CBC, compared to 07/2022: MCHC 31.7, decreased from 32.8; otherwise WNL CMP: WNL  TSH: 1.61  PAP deferred to OBGYN, Martha Cabrera.   S/p Breast Augmentation (retropectoral) 1989; S/p Breast Enhancement Surgery 1990; Mammogram 08/06/2022  normal with repeat scheduled for 6/26.  Colonoscopy 10/12/2022 removed one 6 mm sessile polyp from the cecum (tubular adenoma, negative for high-grade dysplasia and malignancy); Non-bleeding Internal Hemorrhoids with repeat recommendation of 2031.  Bone Density 09/05/2021 T-score -2.1 Lumbar Spine, osteopenic.   Vaccine Counseling: Due for Covid-19 and Shingles 1/2; UTD on Flu and Tdap. Past Medical History:  Diagnosis Date   Hyperlipidemia    Medical/Surgical History Narrative:  Allergic/Intolerant to:  Allergies  Allergen Reactions   Metoprolol  Succinate [Metoprolol   Tartrate] Other (See Comments)    Severe depression and negative thought pattern Metoprolol  Succinate (Toprol  XL)   2024 - E.coli UTI in January  2023 - On September 19th was seen in this office for an acute UTI, and was treated with Cipro . Culture grew E.coli.    2020 - contracted Covid-19 in October after a hunting trip to [place], but recovered swiftly    Past Surgical History:  Procedure Laterality Date   AUGMENTATION MAMMAPLASTY  1989   RETRO PECTORAL SALINE   BLADDER SUSPENSION     BREAST ENHANCEMENT SURGERY     1990   BREAST SURGERY  1990   Breast Augmentation   Family History  Problem Relation Age of Onset   Depression Mother    Diabetes Father    Hyperlipidemia Father    Hypertension Father    Crohn's disease Father    Hypertension Brother    ADD / ADHD Son    Depression Son    Anxiety disorder Son    Colon cancer Neg Hx    Esophageal cancer Neg Hx    Stomach cancer Neg Hx    Rectal cancer Neg Hx    Family History Narrative: No Family History of GI Cancers Father, aged 30, w/ hx of Hypertension, Diabetes, Hyperlipidemia, and Crohn's Disease  Mother, aged 71, w/ hx of Depression Brother w/ hx of Hypertension 2 Sons - 1 w/ hx of Cystic Fibrosis, otherwise there is hx of ADD/ADHD, Anxiety Disorder,and Depression  Social History   Social History Narrative   Not on file  Married. Has 2 sons. 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  Planning to retire mid-2027, husband plans to retire end of 2027. Walking for exercise.   Review of Systems  Constitutional:  Negative for chills, fever, malaise/fatigue and weight loss.  HENT:  Negative for hearing loss, sinus pain and sore throat.   Respiratory:  Negative for cough, hemoptysis and shortness of breath.   Cardiovascular:  Negative for chest pain, palpitations, leg swelling and PND.  Gastrointestinal:  Negative for abdominal pain, constipation, diarrhea, heartburn, nausea and vomiting.  Genitourinary:  Negative for dysuria, frequency and urgency.  Musculoskeletal:  Negative for back pain, myalgias and neck pain.   Skin:  Negative for itching and rash.  Neurological:  Negative for dizziness, tingling, seizures and headaches.  Endo/Heme/Allergies:  Negative for polydipsia.  Psychiatric/Behavioral:  Negative for depression. The patient is not nervous/anxious.     Objective:  Vitals: BP 100/70   Pulse 63   Ht 5' 8.5 (1.74 m)   Wt 147 lb (66.7 kg)   LMP 06/04/2015   SpO2 97%   BMI 22.03 kg/m  Physical Exam Vitals and nursing note reviewed.  Constitutional:      General: She is not in acute distress.    Appearance: Normal appearance. She is not ill-appearing or toxic-appearing.  HENT:     Head: Normocephalic and atraumatic.     Right Ear: Hearing, tympanic membrane, ear canal and external ear normal.     Left Ear: Hearing, tympanic membrane, ear canal and external ear normal.     Mouth/Throat:     Pharynx: Oropharynx is clear.   Eyes:     Extraocular Movements: Extraocular movements intact.     Pupils: Pupils are equal, round, and reactive to light.   Neck:     Thyroid :  No thyroid  mass, thyromegaly or thyroid  tenderness.     Vascular: No carotid bruit.   Cardiovascular:     Rate and Rhythm: Normal rate and regular rhythm. No extrasystoles are present.    Pulses:          Dorsalis pedis pulses are 2+ on the right side and 2+ on the left side.     Heart sounds: Normal heart sounds. No murmur heard.    No friction rub. No gallop.  Pulmonary:     Effort: Pulmonary effort is normal.     Breath sounds: Normal breath sounds. No decreased breath sounds, wheezing, rhonchi or rales.  Chest:     Chest wall: No mass.     Comments: Breast exam deferred  Abdominal:     Palpations: Abdomen is soft. There is no hepatomegaly, splenomegaly or mass.     Tenderness: There is no abdominal tenderness.     Hernia: No hernia is present.   Musculoskeletal:     Cervical back: Normal range of motion.     Right lower leg: No edema.     Left lower leg: No edema.  Lymphadenopathy:     Cervical: No  cervical adenopathy.     Upper Body:     Right upper body: No supraclavicular adenopathy.     Left upper body: No supraclavicular adenopathy.   Skin:    General: Skin is warm and dry.   Neurological:     General: No focal deficit present.     Mental Status: She is alert and oriented to person, place, and time. Mental status is at baseline.     Sensory: Sensation is intact.     Motor: Motor function is intact. No weakness.     Deep Tendon Reflexes: Reflexes are normal and symmetric.   Psychiatric:        Attention and Perception: Attention normal.        Mood and Affect: Mood normal.        Speech: Speech normal.        Behavior: Behavior normal.        Thought Content: Thought content normal.        Cognition and Memory: Cognition normal.        Judgment: Judgment normal.    Most Recent Fall Risk Assessment:    03/13/2022    2:07 PM  Fall Risk   Falls in the past year? 0  Number falls in past yr: 0  Injury with Fall? 0  Risk for fall due to : No Fall Risks  Follow up Falls prevention discussed      Data saved with a previous flowsheet row definition   Most Recent Depression Screenings:    03/14/2023    2:37 PM 03/13/2022    2:08 PM  PHQ 2/9 Scores  PHQ - 2 Score 0   Exception Documentation  Other- indicate reason in comment box  Not completed  PHQ9 done on 03/02/22 patient is scheduled to see psychiatrist   Results:  Studies Obtained And Personally Reviewed By Me:  2022 Coronary Cardiac Score: 0.   Mammogram 08/06/2022  normal with repeat scheduled for 6/26.  Colonoscopy 10/12/2022 removed one 6 mm sessile polyp from the cecum (tubular adenoma, negative for high-grade dysplasia and malignancy); Non-bleeding Internal Hemorrhoids with repeat recommendation of 2031.  Bone Density 09/05/2021 T-score -2.1 Lumbar Spine, osteopenic.   Labs:     Component Value Date/Time   NA 141 08/02/2023 0918   NA 139 04/17/2022  1309   K 5.2 08/02/2023 0918   CL 104 08/02/2023  0918   CO2 30 08/02/2023 0918   GLUCOSE 92 08/02/2023 0918   BUN 20 08/02/2023 0918   BUN 21 04/17/2022 1309   CREATININE 0.70 08/02/2023 0918   CALCIUM  9.2 08/02/2023 0918   PROT 6.5 08/02/2023 0918   PROT 6.7 08/12/2017 0931   ALBUMIN 4.5 08/12/2017 0931   AST 20 08/02/2023 0918   ALT 14 08/02/2023 0918   ALKPHOS 67 08/12/2017 0931   BILITOT 0.5 08/02/2023 0918   BILITOT 0.6 08/12/2017 0931   GFRNONAA >60 11/15/2021 1618   GFRNONAA 96 06/14/2020 0939   GFRAA 111 06/14/2020 0939    Lab Results  Component Value Date   WBC 5.0 08/02/2023   HGB 13.8 08/02/2023   HCT 43.5 08/02/2023   MCV 92.0 08/02/2023   PLT 244 08/02/2023   Lab Results  Component Value Date   CHOL 259 (H) 08/02/2023   HDL 84 08/02/2023   LDLCALC 160 (H) 08/02/2023   TRIG 48 08/02/2023   CHOLHDL 3.1 08/02/2023   Lab Results  Component Value Date   TSH 1.61 08/02/2023    Assessment & Plan:   Orders Placed This Encounter  Procedures   Urine Culture   POCT URINALYSIS DIP (CLINITEK)  Other Labs Reviewed today: CBC, compared to 07/2022: MCHC 31.7, decreased from 32.8; otherwise WNL CMP: WNL  TSH: 1.61  Palpitations; Paroxymal Atrial Flutter treated with Diltiazem  120 mg daily. Followed by Dr. Ransom Byers, Cardiologist in [facility] and stable with no recent episodes. Recent treadmill test was negative for ischemia. Holter monitor was ordered by Cardiologist  on June 13 and is pending review  Hyperlipidemia previously treated with Rosuvastatin  5 mg daily. 08/02/2023 Lipid Panel: Cholesterol 259, elevated from 166; LDL 160, elevated from 69 in 07/2022; otherwise WNL. 2022 Coronary Cardiac Score: 0.   Migraine Headaches treated with Sumatriptan  100 mg as needed.   Vitamin-D Deficiency treated with Vitamin-D 5000 units.    History of Depression treated with Effexor-XR 37.5 mg daily. Symptoms are stable and she is happy with new job.  PAP deferred to OBGYN, Martha Cabrera.   S/p Breast Augmentation  (retropectoral) 1989; S/p Breast Enhancement Surgery 1990; Mammogram 08/06/2022  normal with repeat scheduled for 6/26.  Colonoscopy 10/12/2022 removed one 6 mm sessile polyp from the cecum (tubular adenoma, negative for high-grade dysplasia and malignancy); Non-bleeding Internal Hemorrhoids with repeat recommendation of 2031.  Bone Density 09/05/2021 Lowest T score was  -2.1 in Lumbar Spine c/w osteopenia   Vaccine Counseling: Discussed  Covid-19 and Shingles 1/2;  Currently UTD on Flu and Tdap.    Annual wellness visit done today including the all of the following: Reviewed patient's Family Medical History Reviewed and updated list of patient's medical providers Assessment of cognitive impairment was done Assessed patient's functional ability Established a written schedule for health screening services Health Risk Assessent Completed and Reviewed  Discussed health benefits of physical activity, and encouraged her to engage in regular exercise appropriate for her age and condition.    I,Emily Lagle,acting as a Neurosurgeon for Sylvan Evener, MD.,have documented all relevant documentation on the behalf of Sylvan Evener, MD,as directed by  Sylvan Evener, MD while in the presence of Sylvan Evener, MD.   I, Sylvan Evener, MD, have reviewed all documentation for this visit. The documentation on 08/06/23 for the exam, diagnosis, procedures, and orders are all accurate and complete.

## 2023-08-06 ENCOUNTER — Encounter: Payer: Self-pay | Admitting: Internal Medicine

## 2023-08-06 NOTE — Patient Instructions (Signed)
 It was a pleasure to see you today. Please continue current meds and follow up in 1 year. We also discussed seeing Dr. Tedra Fears, GYN.

## 2023-08-07 LAB — URINE CULTURE
MICRO NUMBER:: 16584668
SPECIMEN QUALITY:: ADEQUATE

## 2023-08-08 ENCOUNTER — Ambulatory Visit: Payer: Self-pay

## 2023-08-12 ENCOUNTER — Telehealth: Payer: Self-pay | Admitting: Internal Medicine

## 2023-08-12 NOTE — Telephone Encounter (Signed)
 Inbound call from patient requesting a call to discuss pathology report from 09/2022 colonoscopy. Please advise, thank you

## 2023-08-13 NOTE — Telephone Encounter (Signed)
 Lm on vm for patient to return call

## 2023-08-13 NOTE — Telephone Encounter (Signed)
 Patient returned call. We reviewed pathology results from 09/2022. Patient states that she couldn't find letter in MyChart but she was looking under results. I told patient that I will send letter again today with results. Patient verbalized understanding and had no concerns at the end of the call.

## 2023-08-13 NOTE — Telephone Encounter (Signed)
 Patient returned call

## 2023-08-15 ENCOUNTER — Ambulatory Visit: Payer: Self-pay

## 2023-08-21 ENCOUNTER — Ambulatory Visit
Admission: RE | Admit: 2023-08-21 | Discharge: 2023-08-21 | Disposition: A | Discharge status: Home / Self Care | Payer: Self-pay | Source: Ambulatory Visit | Attending: Internal Medicine | Admitting: Internal Medicine

## 2023-08-21 DIAGNOSIS — Z1231 Encounter for screening mammogram for malignant neoplasm of breast: Secondary | ICD-10-CM

## 2024-03-10 ENCOUNTER — Other Ambulatory Visit: Payer: Self-pay | Admitting: Internal Medicine

## 2024-03-18 ENCOUNTER — Encounter: Payer: Self-pay | Admitting: Internal Medicine

## 2024-03-18 ENCOUNTER — Ambulatory Visit: Admitting: Internal Medicine

## 2024-03-18 VITALS — BP 108/70 | HR 67 | Temp 98.2°F | Ht 68.5 in | Wt 145.0 lb

## 2024-03-18 DIAGNOSIS — R350 Frequency of micturition: Secondary | ICD-10-CM | POA: Diagnosis not present

## 2024-03-18 DIAGNOSIS — I4892 Unspecified atrial flutter: Secondary | ICD-10-CM

## 2024-03-18 DIAGNOSIS — E785 Hyperlipidemia, unspecified: Secondary | ICD-10-CM

## 2024-03-18 DIAGNOSIS — R300 Dysuria: Secondary | ICD-10-CM

## 2024-03-18 DIAGNOSIS — E7800 Pure hypercholesterolemia, unspecified: Secondary | ICD-10-CM

## 2024-03-18 DIAGNOSIS — N390 Urinary tract infection, site not specified: Secondary | ICD-10-CM | POA: Diagnosis not present

## 2024-03-18 DIAGNOSIS — Z8669 Personal history of other diseases of the nervous system and sense organs: Secondary | ICD-10-CM

## 2024-03-18 LAB — POCT URINALYSIS DIP (CLINITEK)
Bilirubin, UA: NEGATIVE
Glucose, UA: NEGATIVE mg/dL
Ketones, POC UA: NEGATIVE mg/dL
Nitrite, UA: POSITIVE — AB
POC PROTEIN,UA: 100 — AB
Spec Grav, UA: 1.02
Urobilinogen, UA: 0.2 U/dL
pH, UA: 6

## 2024-03-18 MED ORDER — CIPROFLOXACIN HCL 500 MG PO TABS: 500.0000 mg | ORAL_TABLET | Freq: Two times a day (BID) | ORAL | 0 refills | Status: AC

## 2024-03-18 NOTE — Patient Instructions (Addendum)
 We are sorry you are not feeling well today. You have an acute urinary tract infection and have been prescribed Cipro  500 mg twice daily for 7 days. Culture has been sent. We will notify you with culture results. Call if symptoms worsen or not improving within 48 hours.

## 2024-03-18 NOTE — Progress Notes (Signed)
" ° °  Subjective:    Patient ID: Martha Cabrera, female    DOB: 03/26/1960, 64 y.o.   MRN: [id number]  HPI 64 year old Female  seen today with acute onset of dysuria this morning. No fever chills, nausea, or vomiting.No hematuria.  Had urinary  tract infection in January 2024. Usually has about one  urinary tract infection a year, she says.  Hx Paroxysmal Atrial Flutter presenting as palpitations.Currently on Diltiazem  and has had no recent episodes.Also takes  Rosuvastatin  5 mg daily. And ASA 81 mg daily. Seen by [facility] in [facility].  Hx migraine headaches treated with Imitrex .  Also takes Effexor 37.5 mg daily.  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 Currently not working. Married.   Review of Systems no back pain, no fever , chills,  nausea or vomiting.     Objective:   Physical Exam   BP 108/70, pulse 67, T 98.2 degrees, weight 145 lbs.   No CVA tenderness   Urine is red in color, cloudy. Dipstick shows 2+LE and positive nitrite.    Assessment & Plan:   Acute urinary tract infection Hx of migraine headaches Hx of PAF Hyperlipidemia  Plan: Urine culture ordered.Take Cipro  500 mg twice daily x 7 days. Will contact pt with urine culture results.  Martha JINNY Hailstone, MD, have reviewed all documentation for this visit. The documentation on 03/18/2024 for the exam, diagnosis, procedures, and orders are all accurate and complete.  "

## 2024-03-19 ENCOUNTER — Ambulatory Visit: Payer: Self-pay | Admitting: Internal Medicine

## 2024-03-19 LAB — URINE CULTURE
MICRO NUMBER:: 17521543
SPECIMEN QUALITY:: ADEQUATE

## 2024-08-10 ENCOUNTER — Other Ambulatory Visit: Payer: Self-pay

## 2024-08-11 ENCOUNTER — Encounter: Payer: Self-pay | Admitting: Internal Medicine
